# Patient Record
Sex: Male | Born: 1990 | Race: White | Hispanic: No | Marital: Single | Smoking: Current every day smoker
Health system: Southern US, Community
[De-identification: ages and names within clinical notes are randomized; demographics above are authoritative.]

## PROBLEM LIST (undated history)

## (undated) DIAGNOSIS — I10 Essential (primary) hypertension: Secondary | ICD-10-CM

## (undated) HISTORY — DX: Essential (primary) hypertension: I10

---
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
## 2010-11-05 HISTORY — PX: SCAPHOID FRACTURE SURGERY: SHX769

## 2011-09-06 ENCOUNTER — Other Ambulatory Visit: Payer: Self-pay | Admitting: Sports Medicine

## 2011-09-06 DIAGNOSIS — M79642 Pain in left hand: Secondary | ICD-10-CM

## 2011-09-06 DIAGNOSIS — IMO0002 Reserved for concepts with insufficient information to code with codable children: Secondary | ICD-10-CM

## 2011-09-06 DIAGNOSIS — S62009A Unspecified fracture of navicular [scaphoid] bone of unspecified wrist, initial encounter for closed fracture: Secondary | ICD-10-CM

## 2011-09-12 ENCOUNTER — Ambulatory Visit
Admission: RE | Admit: 2011-09-12 | Discharge: 2011-09-12 | Disposition: A | Discharge status: Home / Self Care | Payer: 59 | Source: Ambulatory Visit | Attending: Sports Medicine | Admitting: Sports Medicine

## 2011-09-12 DIAGNOSIS — S62009A Unspecified fracture of navicular [scaphoid] bone of unspecified wrist, initial encounter for closed fracture: Secondary | ICD-10-CM

## 2011-09-12 DIAGNOSIS — M79642 Pain in left hand: Secondary | ICD-10-CM

## 2011-09-12 DIAGNOSIS — IMO0002 Reserved for concepts with insufficient information to code with codable children: Secondary | ICD-10-CM

## 2015-06-03 ENCOUNTER — Ambulatory Visit (INDEPENDENT_AMBULATORY_CARE_PROVIDER_SITE_OTHER): Payer: 59 | Admitting: Family Medicine

## 2015-06-03 ENCOUNTER — Ambulatory Visit
Admission: RE | Admit: 2015-06-03 | Discharge: 2015-06-03 | Disposition: A | Discharge status: Home / Self Care | Payer: 59 | Source: Ambulatory Visit | Attending: Family Medicine | Admitting: Family Medicine

## 2015-06-03 ENCOUNTER — Encounter: Payer: Self-pay | Admitting: Family Medicine

## 2015-06-03 VITALS — BP 142/70 | HR 93 | Temp 99.1°F | Resp 18 | Ht 69.0 in | Wt 182.9 lb

## 2015-06-03 DIAGNOSIS — M7989 Other specified soft tissue disorders: Secondary | ICD-10-CM | POA: Insufficient documentation

## 2015-06-03 NOTE — Progress Notes (Signed)
Name: Timothy Padilla   MRN: [id number]    DOB: [birth year]   Date:06/03/2015       Progress Note  Subjective  Chief Complaint  Chief Complaint  Patient presents with  . Establish Care    NP (Dr. Carman Ching)  . Hand Pain    pain in right hand for x4 days.    HPI Pt. Is here for complaints of pain in the right hand and inability to fully extend the pinky finger. He noticed pain in the lateral aspect of right hand for 2-3 days which went away and now he cannot extend the pinky finger. He can make a fist with no problem. No history of trauma to right hand, wrist, or elbow. History reviewed. No pertinent past medical history.  Past Surgical History  Procedure Laterality Date  . Scaphoid fracture surgery Left 2012    History reviewed. No pertinent family history.  History   Social History  . Marital Status: Single    Spouse Name: N/A  . Number of Children: N/A  . Years of Education: N/A   Occupational History  . Not on file.   Social History Main Topics  . Smoking status: Current Every Day Smoker -- 2.00 packs/day for 9 years    Types: Cigarettes  . Smokeless tobacco: Former Neurosurgeon    Types: Snuff     Comment: just tried smokeless 2 times, does not currently use  . Alcohol Use: 0.0 oz/week    0 Standard drinks or equivalent per week     Comment: occasional  . Drug Use: No  . Sexual Activity:    Partners: Female    Pharmacist, hospital Protection: Condom   Other Topics Concern  . Not on file   Social History Narrative  . No narrative on file    No current outpatient prescriptions on file.  No Known Allergies   Review of Systems  Constitutional: Negative for fever, chills and malaise/fatigue.  Musculoskeletal: Positive for joint pain.      Objective  Filed Vitals:   06/03/15 1022  BP: 142/70  Pulse: 93  Temp: 99.1 F (37.3 C)  TempSrc: Oral  Resp: 18  Height:  (1.753 m)  Weight: 182 lb 14.4 oz (82.963 kg)  SpO2: 98%    Physical Exam  Constitutional:  He is well-developed, well-nourished, and in no distress.  Cardiovascular: Normal rate and regular rhythm.   Pulmonary/Chest: Effort normal and breath sounds normal.  Musculoskeletal:       Hands: Nursing note and vitals reviewed.     Assessment & Plan 1. Swelling of finger of right hand Obtain x-rays of right hand to rule out any acute abnormality. Recommended that he take Aleve every night at bedtime. Follow-up in 4 weeks. - DG Hand Complete Right; Future    Shadawn Hanaway Asad [facility] 06/03/2015 10:35 AM

## 2015-06-07 ENCOUNTER — Telehealth: Payer: Self-pay

## 2015-06-07 NOTE — Progress Notes (Signed)
Patient notified

## 2015-06-08 NOTE — Telephone Encounter (Signed)
Patient was notified of their hand X-Ray and states they are still having swell. I informed the patient the x-ray was normal to try Ibuprofen, Ice and heat, then if no improvements to come back for a visit.

## 2015-07-01 ENCOUNTER — Ambulatory Visit: Payer: 59 | Admitting: Family Medicine

## 2016-03-21 ENCOUNTER — Encounter: Payer: 59 | Admitting: Family Medicine

## 2016-03-27 ENCOUNTER — Ambulatory Visit (INDEPENDENT_AMBULATORY_CARE_PROVIDER_SITE_OTHER): Payer: 59 | Admitting: Family Medicine

## 2016-03-27 ENCOUNTER — Encounter: Payer: Self-pay | Admitting: Family Medicine

## 2016-03-27 VITALS — BP 138/81 | HR 87 | Temp 98.4°F | Resp 17 | Ht 69.0 in | Wt 197.1 lb

## 2016-03-27 DIAGNOSIS — IMO0001 Reserved for inherently not codable concepts without codable children: Secondary | ICD-10-CM

## 2016-03-27 DIAGNOSIS — R079 Chest pain, unspecified: Secondary | ICD-10-CM

## 2016-03-27 DIAGNOSIS — R030 Elevated blood-pressure reading, without diagnosis of hypertension: Secondary | ICD-10-CM | POA: Diagnosis not present

## 2016-03-27 DIAGNOSIS — Z566 Other physical and mental strain related to work: Secondary | ICD-10-CM

## 2016-03-27 HISTORY — DX: Other physical and mental strain related to work: Z56.6

## 2016-03-27 MED ORDER — BUSPIRONE HCL 7.5 MG PO TABS: 7.5000 mg | ORAL_TABLET | Freq: Two times a day (BID) | ORAL | Status: DC

## 2016-03-27 MED ORDER — AMLODIPINE BESYLATE 2.5 MG PO TABS: 2.5000 mg | ORAL_TABLET | Freq: Every day | ORAL | Status: DC

## 2016-03-27 NOTE — Progress Notes (Signed)
Name: Timothy Padilla   MRN: [id number]    DOB: 11/11/1990   Date:03/27/2016       Progress Note  Subjective  Chief Complaint  Chief Complaint  Patient presents with  . Annual Exam    CPE    Chest Pain  This is a recurrent problem. The current episode started more than 1 month ago (present for over 6 months.). The onset quality is gradual. The problem has been unchanged. The pain does not radiate. Associated symptoms include headaches (intermittent headaches once every 2-3 days, worse with stress, hunger, and irritability). Pertinent negatives include no fever or palpitations.   Intermittent but recurrent episodes of substernal and left sided chest pains, last for 1 hour or less, no radiation, nausea or vomiting, sometimes associated with elevated blood pressure ([facility] 223[facility] to 117/74). Sometimes brought on by feeling angry or irritable.   History reviewed. No pertinent past medical history.  Past Surgical History  Procedure Laterality Date  . Scaphoid fracture surgery Left 2012    History reviewed. No pertinent family history.  Social History   Social History  . Marital Status: Single    Spouse Name: N/A  . Number of Children: N/A  . Years of Education: N/A   Occupational History  . Not on file.   Social History Main Topics  . Smoking status: Current Every Day Smoker -- 2.00 packs/day for 9 years    Types: Cigarettes  . Smokeless tobacco: Former NeurosurgeonUser    Types: Snuff     Comment: just tried smokeless 2 times, does not currently use  . Alcohol Use: 0.0 oz/week    0 Standard drinks or equivalent per week     Comment: occasional  . Drug Use: No  . Sexual Activity:    Partners: Female    Pharmacist, hospitalBirth Control/ Protection: Condom   Other Topics Concern  . Not on file   Social History Narrative     Current outpatient prescriptions:  .  amLODipine (NORVASC) 2.5 MG tablet, Take 1 tablet (2.5 mg total) by  mouth daily., Disp: 90 tablet, Rfl: 0 .  busPIRone (BUSPAR) 7.5 MG tablet, Take 1 tablet (7.5 mg total) by mouth 2 (two) times daily., Disp: 60 tablet, Rfl: 0  No Known Allergies   Review of Systems  Constitutional: Negative for fever and chills.  Eyes: Negative for blurred vision (intermittent blurred vision).  Cardiovascular: Positive for chest pain. Negative for palpitations and leg swelling.  Neurological: Positive for headaches (intermittent headaches once every 2-3 days, worse with stress, hunger, and irritability).  Psychiatric/Behavioral: The patient is nervous/anxious.     Objective  Filed Vitals:   03/27/16 1109  BP: 138/81  Pulse: 87  Temp: 98.4 F (36.9 C)  TempSrc: Oral  Resp: 17  Height: 5\' 9"  (1.753 m)  Weight: 197 lb 1.6 oz (89.404 kg)  SpO2: 98%    Physical Exam  Constitutional: He is oriented to person, place, and time and well-developed, well-nourished, and in no distress.  HENT:  Head: Normocephalic and atraumatic.  Cardiovascular: Normal rate and regular rhythm.   Pulmonary/Chest: Effort normal and breath sounds normal.  Neurological: He is alert and oriented to person, place, and time.  Psychiatric: Mood, memory, affect and judgment normal.  Nursing note and vitals reviewed.       Assessment & Plan  1. Chest pain, unspecified chest pain type Baird LyonsCasey reassuring, however with recurrent chest pain and  elevated blood pressure, referred to cardiology - EKG 12-Lead - Ambulatory referral to Cardiology  2. Elevated blood pressure Start on low-dose amlodipine, obtain labs for evaluation of possible end organ damage. Follow-up with blood pressure assessment in one month - CBC with Differential - Comprehensive Metabolic Panel (CMET) - amLODipine (NORVASC) 2.5 MG tablet; Take 1 tablet (2.5 mg total) by mouth daily.  Dispense: 90 tablet; Refill: 0  3. Stress at work [employer] on Buspirone 7.5 g twice daily, educated on adverse effects, follow-up in one  month. - busPIRone (BUSPAR) 7.5 MG tablet; Take 1 tablet (7.5 mg total) by mouth 2 (two) times daily.  Dispense: 60 tablet; Refill: 0   Keslyn Teater [facility] 03/27/2016 8:05 PM

## 2016-03-28 LAB — CBC WITH DIFFERENTIAL/PLATELET
BASOS ABS: 0 10*3/uL (ref 0.0–0.2)
Basos: 0 %
EOS (ABSOLUTE): 0.1 10*3/uL (ref 0.0–0.4)
Eos: 2 %
HEMATOCRIT: 45.4 % (ref 37.5–51.0)
Hemoglobin: 16.2 g/dL (ref 12.6–17.7)
IMMATURE GRANULOCYTES: 0 %
Immature Grans (Abs): 0 10*3/uL (ref 0.0–0.1)
LYMPHS ABS: 1.5 10*3/uL (ref 0.7–3.1)
Lymphs: 35 %
MCH: 31.5 pg (ref 26.6–33.0)
MCHC: 35.7 g/dL (ref 31.5–35.7)
MCV: 88 fL (ref 79–97)
Monocytes Absolute: 0.3 10*3/uL (ref 0.1–0.9)
Monocytes: 6 %
NEUTROS PCT: 57 %
Neutrophils Absolute: 2.5 10*3/uL (ref 1.4–7.0)
PLATELETS: 143 10*3/uL — AB (ref 150–379)
RBC: 5.15 x10E6/uL (ref 4.14–5.80)
RDW: 12.5 % (ref 12.3–15.4)
WBC: 4.3 10*3/uL (ref 3.4–10.8)

## 2016-03-28 LAB — COMPREHENSIVE METABOLIC PANEL
ALK PHOS: 69 IU/L (ref 39–117)
ALT: 19 IU/L (ref 0–44)
AST: 17 IU/L (ref 0–40)
Albumin/Globulin Ratio: 1.7 (ref 1.2–2.2)
Albumin: 4.3 g/dL (ref 3.5–5.5)
BUN/Creatinine Ratio: 18 (ref 9–20)
BUN: 13 mg/dL (ref 6–20)
Bilirubin Total: 0.8 mg/dL (ref 0.0–1.2)
CO2: 24 mmol/L (ref 18–29)
CREATININE: 0.74 mg/dL — AB (ref 0.76–1.27)
Calcium: 9.3 mg/dL (ref 8.7–10.2)
Chloride: 102 mmol/L (ref 96–106)
GFR calc Af Amer: 148 mL/min/{1.73_m2} (ref >59)
GFR calc non Af Amer: 128 mL/min/{1.73_m2} (ref >59)
GLOBULIN, TOTAL: 2.6 g/dL (ref 1.5–4.5)
Glucose: 91 mg/dL (ref 65–99)
POTASSIUM: 4.8 mmol/L (ref 3.5–5.2)
SODIUM: 141 mmol/L (ref 134–144)
Total Protein: 6.9 g/dL (ref 6.0–8.5)

## 2016-03-29 ENCOUNTER — Ambulatory Visit (INDEPENDENT_AMBULATORY_CARE_PROVIDER_SITE_OTHER): Payer: 59 | Admitting: Cardiology

## 2016-03-29 ENCOUNTER — Encounter: Payer: Self-pay | Admitting: Cardiology

## 2016-03-29 VITALS — BP 120/88 | HR 68 | Ht 69.0 in | Wt 192.0 lb

## 2016-03-29 DIAGNOSIS — R079 Chest pain, unspecified: Secondary | ICD-10-CM

## 2016-03-29 DIAGNOSIS — R0602 Shortness of breath: Secondary | ICD-10-CM | POA: Diagnosis not present

## 2016-03-29 DIAGNOSIS — R002 Palpitations: Secondary | ICD-10-CM | POA: Diagnosis not present

## 2016-03-29 NOTE — Progress Notes (Signed)
Cardiology Office Note   Date:  03/29/2016   ID:  Timothy Padilla, DOB 06/17/1991, MRN [id number]  Referring Doctor:  Brayton ElSyed Shah, MD   Cardiologist:   Almond LintAileen Jenavee Laguardia, MD   Reason for consultation:  Chief Complaint  Patient presents with  . other    Ref by Dr. Sherryll BurgerShah for chest pain. Meds reviewed by the patient verbally. Pt. c/o chest pain that will radiate to his neck, fluttering in chest and shortness of breath.        History of Present Illness: Timothy Figuresoah Hosack is a 25 y.o. male who presents forChest pain and shortness of breath. This has been going on for quite some time now. Chest pain is located in the anterior chest, sometimes radiating to the left side of the chest. 4-5 out of 10 in severity. Described as a sharp pain that lasts minutes at a time. Randomly occurring. Not really related to positional changes in the arms or the upper body. Sometimes associated with involuntary movement of the neck/head. Associated with some shortness of breath.  Palpitations, not on a daily basis, feeling fluttering sensation in chest, lasts minutes at a time, symptoms mainly in the chest, nonradiating, randomly occurring been going on for years  No fever, cough, colds, abdominal pain. No PND, orthopnea, edema.   ROS:  Please see the history of present illness. Aside from mentioned under HPI, all other systems are reviewed and negative.     Past Medical History  Diagnosis Date  . Hypertension     Past Surgical History  Procedure Laterality Date  . Scaphoid fracture surgery Left 2012     reports that he has been smoking Cigarettes.  He has a 18 pack-year smoking history. He has quit using smokeless tobacco. His smokeless tobacco use included Snuff. He reports that he drinks alcohol. He reports that he does not use illicit drugs.   family history includes Heart attack in his maternal grandmother and paternal grandmother; Hypertension in his father and paternal grandmother.   Current Outpatient  Prescriptions  Medication Sig Dispense Refill  . amLODipine (NORVASC) 2.5 MG tablet Take 1 tablet (2.5 mg total) by mouth daily. 90 tablet 0  . busPIRone (BUSPAR) 7.5 MG tablet Take 1 tablet (7.5 mg total) by mouth 2 (two) times daily. 60 tablet 0   No current facility-administered medications for this visit.    Allergies: Review of patient's allergies indicates no known allergies.    PHYSICAL EXAM: VS:  BP 120/88 mmHg  Pulse 68  Ht 5\' 9"  (1.753 m)  Wt 192 lb (87.091 kg)  BMI 28.34 kg/m2 , Body mass index is 28.34 kg/(m^2). Wt Readings from Last 3 Encounters:  03/29/16 192 lb (87.091 kg)  03/27/16 197 lb 1.6 oz (89.404 kg)  06/03/15 182 lb 14.4 oz (82.963 kg)    GENERAL:  well developed, well nourished, not in acute distress HEENT: normocephalic, pink conjunctivae, anicteric sclerae, no xanthelasma, normal dentition, oropharynx clear NECK:  no neck vein engorgement, JVP normal, no hepatojugular reflux, carotid upstroke brisk and symmetric, no bruit, no thyromegaly, no lymphadenopathy LUNGS:  good respiratory effort, clear to auscultation bilaterally CV:  PMI not displaced, no thrills, no lifts, S1 and S2 within normal limits, no palpable S3 or S4, no murmurs, no rubs, no gallops ABD:  Soft, nontender, nondistended, normoactive bowel sounds, no abdominal aortic bruit, no hepatomegaly, no splenomegaly MS: nontender back, no kyphosis, no scoliosis, no joint deformities EXT:  2+ DP/PT pulses, no edema, no varicosities, no cyanosis,  no clubbing SKIN: warm, nondiaphoretic, normal turgor, no ulcers NEUROPSYCH: alert, oriented to person, place, and time, sensory/motor grossly intact, normal mood, appropriate affect  Recent Labs: 03/27/2016: ALT 19; BUN 13; Creatinine, Ser 0.74*; Platelets 143*; Potassium 4.8; Sodium 141   Lipid Panel No results found for: CHOL, TRIG, HDL, CHOLHDL, VLDL, LDLCALC, LDLDIRECT   Other studies Reviewed:  EKG:  The ekg from 03/29/2016 was personally  reviewed by me and it revealed sinus rhythm, 70 BPM.  Additional studies/ records that were reviewed personally reviewed by me today include: None available   ASSESSMENT AND PLAN: Chest pain, atypical, possibly musculoskeletal Shortness of breath Risk factors for CAD are not significant but mainly smoking history.  Stress testing will provide a rule out of CAD. Plain treadmill test. Recommend echocardiogram.   Palpitations  Recommend objective evaluation with cardiac monitor.   Tobacco use  We discussed the importance of smoking cessation and different strategies for quitting.   Current medicines are reviewed at length with the patient today.  The patient does not have concerns regarding medicines.  Labs/ tests ordered today include:  Orders Placed This Encounter  Procedures  . Exercise Tolerance Test  . Cardiac event monitor  . EKG 12-Lead  . ECHOCARDIOGRAM COMPLETE    I had a lengthy and detailed discussion with the patient regarding diagnoses, prognosis, diagnostic options, treatment options .   I counseled the patient on importance of lifestyle modification including heart healthy diet, regular physical activity  , and smoking cessation.   Disposition:   FU with undersigned after tests When necessary  Signed, Almond Lint, MD  03/29/2016 12:25 PM    [facility]

## 2016-03-29 NOTE — Patient Instructions (Addendum)
Medication Instructions:  Your physician recommends that you continue on your current medications as directed. Please refer to the Current Medication list given to you today.   Labwork: None ordered  Testing/Procedures: Your physician has requested that you have an echocardiogram. Echocardiography is a painless test that uses sound waves to create images of your heart. It provides your doctor with information about the size and shape of your heart and how well your heart's chambers and valves are working. This procedure takes approximately one hour. There are no restrictions for this procedure.  Date & Time: ______________________________________________________________  Your physician has requested that you have an exercise tolerance test. For further information please visit [email or web address] Please also follow instruction sheet, as given.  Date & Time: ________________________________________________________________  Your physician has recommended that you wear an event monitor. Event monitors are medical devices that record the heart's electrical activity. Doctors most often [place] these monitors to diagnose arrhythmias. Arrhythmias are problems with the speed or rhythm of the heartbeat. The monitor is a small, portable device. You can wear one while you do your normal daily activities. This is usually used to diagnose what is causing palpitations/syncope (passing out).  Will be mailed to your home.   Follow-Up: Your physician recommends that you schedule a follow-up appointment as needed. We will call you with results and if needed will schedule follow up at that time.    Any Other Special Instructions Will Be Listed Below (If Applicable).     If you need a refill on your cardiac medications before your next appointment, please call your pharmacy.  Echocardiogram An echocardiogram, or echocardiography, uses sound waves (ultrasound) to produce an image of your heart. The  echocardiogram is simple, painless, obtained within a short period of time, and offers valuable information to your health care provider. The images from an echocardiogram can provide information such as:  Evidence of coronary artery disease (CAD).  Heart size.  Heart muscle function.  Heart valve function.  Aneurysm detection.  Evidence of a past heart attack.  Fluid buildup around the heart.  Heart muscle thickening.  Assess heart valve function. LET [facility] CARE PROVIDER KNOW ABOUT:  Any allergies you have.  All medicines you are taking, including vitamins, herbs, eye drops, creams, and over-the-counter medicines.  Previous problems you or members of your family have had with the use of anesthetics.  Any blood disorders you have.  Previous surgeries you have had.  Medical conditions you have.  Possibility of pregnancy, if this applies. BEFORE THE PROCEDURE  No special preparation is needed. Eat and drink normally.  PROCEDURE   In order to produce an image of your heart, gel will be applied to your chest and a wand-like tool (transducer) will be moved over your chest. The gel will help transmit the sound waves from the transducer. The sound waves will harmlessly bounce off your heart to allow the heart images to be captured in real-time motion. These images will then be recorded.  You may need an IV to receive a medicine that improves the quality of the pictures. AFTER THE PROCEDURE You may return to your normal schedule including diet, activities, and medicines, unless your health care provider tells you otherwise.   This information is not intended to replace advice given to you by your health care provider. Make sure you discuss any questions you have with your health care provider.   Document Released: 10/19/2000 Document Revised: 11/12/2014 Document Reviewed: 06/29/2013 Elsevier Interactive Patient Education 2016 Elsevier  Inc.   Exercise Stress  Electrocardiogram An exercise stress electrocardiogram is a test that is done to evaluate the blood supply to your heart. This test may also be called exercise stress electrocardiography. The test is done while you are walking on a treadmill. The goal of this test is to raise your heart rate. This test is done to find areas of poor blood flow to the heart by determining the extent of coronary artery disease (CAD).   CAD is defined as narrowing in one or more heart (coronary) arteries of more than 70%. If you have an abnormal test result, this may mean that you are not getting adequate blood flow to your heart during exercise. Additional testing may be needed to understand why your test was abnormal. LET [facility] CARE PROVIDER KNOW ABOUT:   Any allergies you have.  All medicines you are taking, including vitamins, herbs, eye drops, creams, and over-the-counter medicines.  Previous problems you or members of your family have had with the use of anesthetics.  Any blood disorders you have.  Previous surgeries you have had.  Medical conditions you have.  Possibility of pregnancy, if this applies. RISKS AND COMPLICATIONS Generally, this is a safe procedure. However, as with any procedure, complications can occur. Possible complications can include:  Pain or pressure in the following areas:  Chest.  Jaw or neck.  Between your shoulder blades.  Radiating down your left arm.  Dizziness or light-headedness.  Shortness of breath.  Increased or irregular heartbeats.  Nausea or vomiting.  Heart attack (rare). BEFORE THE PROCEDURE  Avoid all forms of caffeine 24 hours before your test or as directed by your health care provider. This includes coffee, tea (even decaffeinated tea), caffeinated sodas, chocolate, cocoa, and certain pain medicines.  Follow your health care provider's instructions regarding eating and drinking before the test.  Take your medicines as directed at regular  times with water unless instructed otherwise. Exceptions may include:  If you have diabetes, ask how you are to take your insulin or pills. It is common to adjust insulin dosing the morning of the test.  If you are taking beta-blocker medicines, it is important to talk to your health care provider about these medicines well before the date of your test. Taking beta-blocker medicines may interfere with the test. In some cases, these medicines need to be changed or stopped 24 hours or more before the test.  If you wear a nitroglycerin patch, it may need to be removed prior to the test. Ask your health care provider if the patch should be removed before the test.  If you use an inhaler for any breathing condition, bring it with you to the test.  If you are an outpatient, bring a snack so you can eat right after the stress phase of the test.  Do not smoke for 4 hours prior to the test or as directed by your health care provider.  Do not apply lotions, powders, creams, or oils on your chest prior to the test.  Wear loose-fitting clothes and comfortable shoes for the test. This test involves walking on a treadmill. PROCEDURE  Multiple patches (electrodes) will be put on your chest. If needed, small areas of your chest may have to be shaved to get better contact with the electrodes. Once the electrodes are attached to your body, multiple wires will be attached to the electrodes and your heart rate will be monitored.  Your heart will be monitored both at rest and  while exercising.  You will walk on a treadmill. The treadmill will be started at a slow pace. The treadmill speed and incline will gradually be increased to raise your heart rate. AFTER THE PROCEDURE  Your heart rate and blood pressure will be monitored after the test.  You may return to your normal schedule including diet, activities, and medicines, unless your health care provider tells you otherwise.   This information is not  intended to replace advice given to you by your health care provider. Make sure you discuss any questions you have with your health care provider.   Document Released: 10/19/2000 Document Revised: 10/27/2013 Document Reviewed: 06/29/2013 Elsevier Interactive Patient Education 2016 Elsevier Inc.   Cardiac Event Monitoring A cardiac event monitor is a small recording device used to help detect abnormal heart rhythms (arrhythmias). The monitor is used to record heart rhythm when noticeable symptoms such as the following occur:  Fast heartbeats (palpitations), such as heart racing or fluttering.  Dizziness.  Fainting or light-headedness.  Unexplained weakness. The monitor is wired to two electrodes placed on your chest. Electrodes are flat, sticky disks that attach to your skin. The monitor can be worn for up to 30 days. You will wear the monitor at all times, except when bathing.  HOW TO USE YOUR CARDIAC EVENT MONITOR A technician will prepare your chest for the electrode placement. The technician will show you how to place the electrodes, how to work the monitor, and how to replace the batteries. Take time to practice using the monitor before you leave the office. Make sure you understand how to send the information from the monitor to your health care provider. This requires a telephone with a landline, not a cell phone. You need to:  Wear your monitor at all times, except when you are in water:  Do not get the monitor wet.  Take the monitor off when bathing. Do not swim or use a hot tub with it on.  Keep your skin clean. Do not put body lotion or moisturizer on your chest.  Change the electrodes daily or any time they stop sticking to your skin. You might need to use tape to keep them on.  It is possible that your skin under the electrodes could become irritated. To keep this from happening, try to put the electrodes in slightly different places on your chest. However, they must remain  in the area under your left breast and in the upper right section of your chest.  Make sure the monitor is safely clipped to your clothing or in a location close to your body that your health care provider recommends.  Press the button to record when you feel symptoms of heart trouble, such as dizziness, weakness, light-headedness, palpitations, thumping, shortness of breath, unexplained weakness, or a fluttering or racing heart. The monitor is always on and records what happened slightly before you pressed the button, so do not worry about being too late to get good information.  Keep a diary of your activities, such as walking, doing chores, and taking medicine. It is especially important to note what you were doing when you pushed the button to record your symptoms. This will help your health care provider determine what might be contributing to your symptoms. The information stored in your monitor will be reviewed by your health care provider alongside your diary entries.  Send the recorded information as recommended by your health care provider. It is important to understand that it will take some  time for your health care provider to process the results.  Change the batteries as recommended by your health care provider. SEEK IMMEDIATE MEDICAL CARE IF:   You have chest pain.  You have extreme difficulty breathing or shortness of breath.  You develop a very fast heartbeat that persists.  You develop dizziness that does not go away.  You faint or constantly feel you are about to faint.   This information is not intended to replace advice given to you by your health care provider. Make sure you discuss any questions you have with your health care provider.   Document Released: 07/31/2008 Document Revised: 11/12/2014 Document Reviewed: 04/20/2013 Elsevier Interactive Patient Education [employer]2016 Elsevier Inc.

## 2016-03-30 ENCOUNTER — Ambulatory Visit (INDEPENDENT_AMBULATORY_CARE_PROVIDER_SITE_OTHER): Payer: 59

## 2016-03-30 DIAGNOSIS — R0602 Shortness of breath: Secondary | ICD-10-CM

## 2016-03-30 DIAGNOSIS — R002 Palpitations: Secondary | ICD-10-CM | POA: Diagnosis not present

## 2016-03-30 DIAGNOSIS — R079 Chest pain, unspecified: Secondary | ICD-10-CM

## 2016-03-30 LAB — EXERCISE TOLERANCE TEST
CHL CUP MPHR: 195 {beats}/min
CHL CUP STRESS STAGE 1 GRADE: 0 %
CHL CUP STRESS STAGE 1 HR: 101 {beats}/min
CHL CUP STRESS STAGE 2 HR: 90 {beats}/min
CHL CUP STRESS STAGE 2 SBP: 121 mmHg
CHL CUP STRESS STAGE 3 SPEED: 0 mph
CHL CUP STRESS STAGE 4 GRADE: 10 %
CHL CUP STRESS STAGE 4 SPEED: 1.7 mph
CHL CUP STRESS STAGE 5 GRADE: 12 %
CHL CUP STRESS STAGE 5 HR: 116 {beats}/min
CHL CUP STRESS STAGE 5 SBP: 158 mmHg
CHL CUP STRESS STAGE 5 SPEED: 2.5 mph
CHL CUP STRESS STAGE 6 HR: 137 {beats}/min
CHL CUP STRESS STAGE 6 SBP: 161 mmHg
CHL CUP STRESS STAGE 7 SPEED: 4.2 mph
CHL CUP STRESS STAGE 8 GRADE: 0 %
CHL CUP STRESS STAGE 8 HR: 115 {beats}/min
CHL CUP STRESS STAGE 8 SPEED: 0 mph
CHL CUP STRESS STAGE 9 DBP: 67 mmHg
CHL CUP STRESS STAGE 9 HR: 86 {beats}/min
CHL CUP STRESS STAGE 9 SBP: 153 mmHg
CSEPHR: 86 %
CSEPPMHR: 80 %
Estimated workload: 12.3 METS
Exercise duration (min): 10 min
Exercise duration (sec): 20 s
Peak HR: 157 {beats}/min
Rest HR: 99 {beats}/min
Stage 1 Speed: 0 mph
Stage 2 DBP: 82 mmHg
Stage 2 Grade: 0 %
Stage 2 Speed: 0 mph
Stage 3 Grade: 0 %
Stage 3 HR: 91 {beats}/min
Stage 4 DBP: 66 mmHg
Stage 4 HR: 106 {beats}/min
Stage 4 SBP: 137 mmHg
Stage 5 DBP: 71 mmHg
Stage 6 DBP: 70 mmHg
Stage 6 Grade: 14 %
Stage 6 Speed: 3.4 mph
Stage 7 Grade: 16 %
Stage 7 HR: 157 {beats}/min
Stage 9 Grade: 0 %
Stage 9 Speed: 0 mph

## 2016-04-05 ENCOUNTER — Telehealth: Payer: Self-pay | Admitting: Cardiology

## 2016-04-05 NOTE — Telephone Encounter (Signed)
[employer] needs the icd 10 and icd 9 dx codes .  Please call patient as he needs to contact [facility].

## 2016-04-05 NOTE — Telephone Encounter (Signed)
Left message on machine for patient to contact the office.   

## 2016-04-06 NOTE — Telephone Encounter (Signed)
Left voicemail message for patient to call back.

## 2016-04-06 NOTE — Telephone Encounter (Signed)
Patient wanted diagnosis codes to make sure his event monitor was covered by insurance. Provided her with the ICD 9 and ICD 10 codes and also let her know that we send off for pre-certification as well to make sure it is covered. She did want to know what their out of pocket amount would be and I let her know that they would need to call his benefits to see what that amount might be. She verbalized understanding and had no further questions at this time.

## 2016-04-13 ENCOUNTER — Other Ambulatory Visit: Payer: 59

## 2016-04-13 ENCOUNTER — Encounter: Payer: Self-pay | Admitting: *Deleted

## 2016-04-18 ENCOUNTER — Telehealth: Payer: Self-pay | Admitting: Cardiology

## 2016-04-18 NOTE — Telephone Encounter (Signed)
Received an email from [employer] stated that patient sent the event monitor back and refused due to insurance quote. Patients girlfriend called insurance and they reported the cost to be $2,000.00 instead of the network rate which would be $155.00. Patients girlfriend was not given a quote by insurance because they do not have a release on file to speak with her regarding patient. Therefore they gave her the market price. Attempted to call patient to speak with them about the monitor. Left voicemail message for them to call me back so I can talk with them in detail about this.

## 2016-04-18 NOTE — Telephone Encounter (Signed)
Left detailed voicemail message with estimated cost of $155.00 per [employer] and instructions to please call back regarding this test.

## 2016-04-18 NOTE — Telephone Encounter (Signed)
Spoke with patients girlfriend per release form and discussed the reduced rate that was estimated based on their insurance. She states that they do not want to move forward with that test due to the cost. Let them know that if they change their mind or need anything else to please call back at anytime. She verbalized understanding of our conversation and had no further questions.

## 2016-04-27 ENCOUNTER — Ambulatory Visit (INDEPENDENT_AMBULATORY_CARE_PROVIDER_SITE_OTHER): Payer: 59 | Admitting: Family Medicine

## 2016-04-27 ENCOUNTER — Encounter: Payer: Self-pay | Admitting: Family Medicine

## 2016-04-27 VITALS — BP 108/64 | HR 78 | Temp 98.9°F | Resp 16 | Ht 69.0 in | Wt 197.6 lb

## 2016-04-27 DIAGNOSIS — Z566 Other physical and mental strain related to work: Secondary | ICD-10-CM

## 2016-04-27 DIAGNOSIS — D696 Thrombocytopenia, unspecified: Secondary | ICD-10-CM | POA: Diagnosis not present

## 2016-04-27 DIAGNOSIS — R030 Elevated blood-pressure reading, without diagnosis of hypertension: Secondary | ICD-10-CM

## 2016-04-27 DIAGNOSIS — IMO0001 Reserved for inherently not codable concepts without codable children: Secondary | ICD-10-CM

## 2016-04-27 LAB — CBC WITH DIFFERENTIAL/PLATELET
BASOS ABS: 0 {cells}/uL (ref 0–200)
BASOS PCT: 0 %
EOS ABS: 104 {cells}/uL (ref 15–500)
Eosinophils Relative: 2 %
HEMATOCRIT: 48.3 % (ref 38.5–50.0)
HEMOGLOBIN: 16.9 g/dL (ref 13.2–17.1)
LYMPHS ABS: 1872 {cells}/uL (ref 850–3900)
Lymphocytes Relative: 36 %
MCH: 31.6 pg (ref 27.0–33.0)
MCHC: 35 g/dL (ref 32.0–36.0)
MCV: 90.3 fL (ref 80.0–100.0)
MONO ABS: 468 {cells}/uL (ref 200–950)
MPV: 10.4 fL (ref 7.5–12.5)
Monocytes Relative: 9 %
NEUTROS ABS: 2756 {cells}/uL (ref 1500–7800)
Neutrophils Relative %: 53 %
Platelets: 146 10*3/uL (ref 140–400)
RBC: 5.35 MIL/uL (ref 4.20–5.80)
RDW: 12.9 % (ref 11.0–15.0)
WBC: 5.2 10*3/uL (ref 3.8–10.8)

## 2016-04-27 MED ORDER — BUSPIRONE HCL 7.5 MG PO TABS: 7.5000 mg | ORAL_TABLET | Freq: Two times a day (BID) | ORAL | Status: DC

## 2016-04-27 NOTE — Progress Notes (Signed)
Name: Timothy Padilla   MRN: [id number]    DOB: 10/31/1991   Date:04/27/2016       Progress Note  Subjective  Chief Complaint  Chief Complaint  Patient presents with  . Hypertension    2 week follow up    Hypertension This is a recurrent problem. The problem is controlled. Pertinent negatives include no blurred vision, chest pain, headaches, palpitations or shortness of breath. Risk factors for coronary artery disease include stress. Past treatments include calcium channel blockers. There are no compliance problems.  There is no history of kidney disease, CAD/MI or CVA.    Stress At Work:  Pt. Returns for assessment of stress and anxiety, mainly attributed to work environment. He was started on Buspirone 7.5 mg twice daily at last visit. He reports improvement in symptoms of anxiety and stress after being on Buspirone. He is able to handle the stressful situations better at work. Reports no adverse effects.  Elevated Platelet Count:  Pt.'s lab work drawn at last office visit showed below normal platelet count. He reports no episodes of bleeding, no hereditary bleeding disorder.      Past Medical History  Diagnosis Date  . Hypertension     Past Surgical History  Procedure Laterality Date  . Scaphoid fracture surgery Left 2012    Family History  Problem Relation Age of Onset  . Hypertension Father   . Heart attack Maternal Grandmother   . Heart attack Paternal Grandmother   . Hypertension Paternal Grandmother     Social History   Social History  . Marital Status: Single    Spouse Name: N/A  . Number of Children: N/A  . Years of Education: N/A   Occupational History  . Not on file.   Social History Main Topics  . Smoking status: Current Every Day Smoker -- 2.00 packs/day for 9 years    Types: Cigarettes  . Smokeless tobacco: Former NeurosurgeonUser    Types: Snuff     Comment: just tried smokeless 2 times, does not currently use  . Alcohol Use: 0.0 oz/week    0 Standard  drinks or equivalent per week     Comment: occasional  . Drug Use: No  . Sexual Activity:    Partners: Female    Pharmacist, hospitalBirth Control/ Protection: Condom   Other Topics Concern  . Not on file   Social History Narrative     Current outpatient prescriptions:  .  amLODipine (NORVASC) 2.5 MG tablet, Take 1 tablet (2.5 mg total) by mouth daily., Disp: 90 tablet, Rfl: 0 .  busPIRone (BUSPAR) 7.5 MG tablet, Take 1 tablet (7.5 mg total) by mouth 2 (two) times daily., Disp: 60 tablet, Rfl: 0  No Known Allergies   Review of Systems  Eyes: Negative for blurred vision.  Respiratory: Negative for shortness of breath.   Cardiovascular: Negative for chest pain and palpitations.  Gastrointestinal: Negative for blood in stool.  Genitourinary: Negative for hematuria.  Neurological: Negative for headaches.  Psychiatric/Behavioral: The patient is nervous/anxious.     Objective  Filed Vitals:   04/27/16 1120  BP: 108/64  Pulse: 78  Temp: 98.9 F (37.2 C)  TempSrc: Oral  Resp: 16  Height: 5\' 9"  (1.753 m)  Weight: 197 lb 9.6 oz (89.631 kg)  SpO2: 98%    Physical Exam  Constitutional: He is oriented to person, place, and time and well-developed, well-nourished, and in no distress.  Cardiovascular: Normal rate, regular rhythm, S1 normal and S2 normal.   Murmur heard.  Systolic murmur is present with a grade of 2/6  Pulmonary/Chest: Effort normal and breath sounds normal. He has no wheezes. He has no rales.  Abdominal: Soft. Bowel sounds are normal. There is no tenderness.  Neurological: He is alert and oriented to person, place, and time.  Nursing note and vitals reviewed.    Assessment & Plan  1. Elevated blood pressure Blood pressure has considerably improved with initiation of antihypertensive therapy. I suspect that his stress also plays a big role in his elevated blood pressure. Blood Pressure in our office today is 108/64 and I asked him to take amlodipine 2.5 mg every other day.  Recheck in one month  2. Stress at work Stable and steadily improving, continue on BuSpar as prescribed - busPIRone (BUSPAR) 7.5 MG tablet; Take 1 tablet (7.5 mg total) by mouth 2 (two) times daily.  Dispense: 60 tablet; Refill: 0  3. Decreased platelet count (HCC)  - CBC with Differential   Timothy Padilla A. Faylene [facility] 04/27/2016 11:44 AM

## 2016-05-28 ENCOUNTER — Encounter: Payer: Self-pay | Admitting: Family Medicine

## 2016-05-28 ENCOUNTER — Ambulatory Visit (INDEPENDENT_AMBULATORY_CARE_PROVIDER_SITE_OTHER): Payer: 59 | Admitting: Family Medicine

## 2016-05-28 DIAGNOSIS — Z566 Other physical and mental strain related to work: Secondary | ICD-10-CM | POA: Diagnosis not present

## 2016-05-28 DIAGNOSIS — R030 Elevated blood-pressure reading, without diagnosis of hypertension: Secondary | ICD-10-CM | POA: Diagnosis not present

## 2016-05-28 DIAGNOSIS — IMO0001 Reserved for inherently not codable concepts without codable children: Secondary | ICD-10-CM

## 2016-05-28 MED ORDER — BUSPIRONE HCL 7.5 MG PO TABS: 7.5000 mg | ORAL_TABLET | Freq: Two times a day (BID) | ORAL | 1 refills | Status: DC

## 2016-05-28 MED ORDER — AMLODIPINE BESYLATE 2.5 MG PO TABS: 2.5000 mg | ORAL_TABLET | Freq: Every day | ORAL | 1 refills | Status: DC

## 2016-05-28 NOTE — Progress Notes (Signed)
Name: Timothy Padilla   MRN: [id number]    DOB: Jan 12, 1991   Date:05/28/2016       Progress Note  Subjective  Chief Complaint  Chief Complaint  Patient presents with  . Hypertension    follow up , med refills  . Anxiety    Anxiety  Presents for follow-up visit. Symptoms include excessive worry, irritability and nervous/anxious behavior. Patient reports no chest pain, depressed mood, palpitations, panic or shortness of breath. Symptoms occur most days. The quality of sleep is good.    Hypertension  This is a chronic problem. The problem has been gradually improving since onset. Associated symptoms include anxiety. Pertinent negatives include no chest pain, headaches, palpitations or shortness of breath. Past treatments include calcium channel blockers. The current treatment provides significant improvement. There is no history of kidney disease or CAD/MI.     Past Medical History:  Diagnosis Date  . Hypertension     Past Surgical History:  Procedure Laterality Date  . SCAPHOID FRACTURE SURGERY Left 2012    Family History  Problem Relation Age of Onset  . Hypertension Father   . Heart attack Maternal Grandmother   . Heart attack Paternal Grandmother   . Hypertension Paternal Grandmother     Social History   Social History  . Marital status: Single    Spouse name: N/A  . Number of children: N/A  . Years of education: N/A   Occupational History  . Not on file.   Social History Main Topics  . Smoking status: Current Every Day Smoker    Packs/day: 2.00    Years: 9.00    Types: Cigarettes  . Smokeless tobacco: Former Neurosurgeon    Types: Snuff     Comment: just tried smokeless 2 times, does not currently use  . Alcohol use 0.0 oz/week     Comment: occasional  . Drug use: No  . Sexual activity: Yes    Partners: Female    Birth control/ protection: Condom   Other Topics Concern  . Not on file   Social History Narrative  . No narrative on file     Current  Outpatient Prescriptions:  .  amLODipine (NORVASC) 2.5 MG tablet, Take 1 tablet (2.5 mg total) by mouth daily., Disp: 90 tablet, Rfl: 0 .  busPIRone (BUSPAR) 7.5 MG tablet, Take 1 tablet (7.5 mg total) by mouth 2 (two) times daily., Disp: 60 tablet, Rfl: 0  No Known Allergies   Review of Systems  Constitutional: Positive for irritability. Negative for chills and fever.  Respiratory: Negative for shortness of breath.   Cardiovascular: Negative for chest pain and palpitations.  Neurological: Negative for headaches.  Psychiatric/Behavioral: The patient is nervous/anxious.      Objective  Vitals:   05/28/16 0952  BP: 120/70  Pulse: 73  Resp: 18  Temp: 98.6 F (37 C)  TempSrc: Oral  SpO2: 99%  Weight: 198 lb 12.8 oz (90.2 kg)  Height:  (1.753 m)    Physical Exam  Constitutional: He is oriented to person, place, and time and well-developed, well-nourished, and in no distress.  HENT:  Head: Normocephalic and atraumatic.  Cardiovascular: Normal rate and regular rhythm.   No murmur heard. Pulmonary/Chest: Effort normal and breath sounds normal. He has no wheezes.  Abdominal: Bowel sounds are normal. There is no tenderness.  Musculoskeletal: He exhibits no edema.  Neurological: He is alert and oriented to person, place, and time.  Psychiatric: Mood, memory, affect and judgment normal.  Nursing note and  vitals reviewed.     Assessment & Plan  1. Elevated blood pressure  - amLODipine (NORVASC) 2.5 MG tablet; Take 1 tablet (2.5 mg total) by mouth daily.  Dispense: 90 tablet; Refill: 1  2. Stress at work  - busPIRone (BUSPAR) 7.5 MG tablet; Take 1 tablet (7.5 mg total) by mouth 2 (two) times daily.  Dispense: 180 tablet; Refill: 1    Noa Galvao Asad [facility] 05/28/2016 10:14 AM

## 2016-07-10 ENCOUNTER — Encounter: Payer: 59 | Admitting: Family Medicine

## 2016-07-11 ENCOUNTER — Ambulatory Visit (INDEPENDENT_AMBULATORY_CARE_PROVIDER_SITE_OTHER): Payer: 59 | Admitting: Family Medicine

## 2016-07-11 ENCOUNTER — Encounter: Payer: Self-pay | Admitting: Family Medicine

## 2016-07-11 DIAGNOSIS — Z0000 Encounter for general adult medical examination without abnormal findings: Secondary | ICD-10-CM | POA: Diagnosis not present

## 2016-07-11 NOTE — Progress Notes (Signed)
Name: Timothy Padilla   MRN: [id number]    DOB: 09/25/1991   Date:07/11/2016       Progress Note  Subjective  Chief Complaint  Chief Complaint  Patient presents with  . Annual Exam    and lab work. Pt not fasting today    HPI  Pt. Presents for annual physical exam. He is doing well.   Past Medical History:  Diagnosis Date  . Hypertension     Past Surgical History:  Procedure Laterality Date  . SCAPHOID FRACTURE SURGERY Left 2012    Family History  Problem Relation Age of Onset  . Hypertension Father   . Heart attack Maternal Grandmother   . Heart attack Paternal Grandmother   . Hypertension Paternal Grandmother     Social History   Social History  . Marital status: Single    Spouse name: N/A  . Number of children: N/A  . Years of education: N/A   Occupational History  . Not on file.   Social History Main Topics  . Smoking status: Current Every Day Smoker    Packs/day: 2.00    Years: 9.00    Types: Cigarettes  . Smokeless tobacco: Former NeurosurgeonUser    Types: Snuff     Comment: just tried smokeless 2 times, does not currently use  . Alcohol use 0.0 oz/week     Comment: occasional  . Drug use: No  . Sexual activity: Yes    Partners: Female    Birth control/ protection: Condom   Other Topics Concern  . Not on file   Social History Narrative  . No narrative on file     Current Outpatient Prescriptions:  .  amLODipine (NORVASC) 2.5 MG tablet, Take 1 tablet (2.5 mg total) by mouth daily., Disp: 90 tablet, Rfl: 1 .  busPIRone (BUSPAR) 7.5 MG tablet, Take 1 tablet (7.5 mg total) by mouth 2 (two) times daily., Disp: 180 tablet, Rfl: 1  No Known Allergies   Review of Systems  Constitutional: Negative for chills, fever and malaise/fatigue.  HENT: Negative for congestion and sore throat.   Eyes: Negative for blurred vision and double vision.  Respiratory: Negative for cough, sputum production and shortness of breath.   Cardiovascular: Negative for chest pain  and palpitations.  Gastrointestinal: Negative for abdominal pain, blood in stool, constipation, diarrhea, nausea and vomiting.  Genitourinary: Negative for dysuria, frequency and hematuria.  Musculoskeletal: Negative for back pain and myalgias.  Skin: Positive for itching (at the belt buckle on the lower abdomen, no rash). Negative for rash.  Neurological: Negative for dizziness and headaches.  Psychiatric/Behavioral: Negative for depression. The patient is not nervous/anxious and does not have insomnia.       Objective  Vitals:   07/11/16 1503  BP: 128/76  Pulse: 87  Resp: 16  Temp: 98.3 F (36.8 C)  SpO2: 98%  Weight: 197 lb (89.4 kg)  Height: 5\' 9"  (1.753 m)    Physical Exam  Constitutional: He is oriented to person, place, and time and well-developed, well-nourished, and in no distress.  HENT:  Head: Normocephalic and atraumatic.  Right Ear: No drainage.  Left Ear: Tympanic membrane and ear canal normal.  Mouth/Throat: Oropharynx is clear and moist. No posterior oropharyngeal erythema.  Right ear canal has extra cerumen  Eyes: Conjunctivae are normal. Pupils are equal, round, and reactive to light.  Neck: Neck supple.  Cardiovascular: Normal rate, regular rhythm, S1 normal, S2 normal and normal heart sounds.   Pulmonary/Chest: Effort normal and breath  sounds normal. He has no wheezes. He has no rhonchi.  Abdominal: Soft. Bowel sounds are normal. There is no tenderness.  Musculoskeletal:       Right ankle: He exhibits no swelling.       Left ankle: He exhibits no swelling.  Neurological: He is alert and oriented to person, place, and time.  Skin: Skin is warm, dry and intact.  Psychiatric: Mood, memory, affect and judgment normal.  Nursing note and vitals reviewed.    Assessment & Plan  1. Annual physical exam Obtain age-appropriate laboratory screenings - Lipid Profile - TSH - Vitamin D (25 hydroxy)   Onda Kattner Asad A. [facility] 07/11/2016 3:07 PM

## 2017-03-14 ENCOUNTER — Encounter: Payer: Self-pay | Admitting: Family Medicine

## 2017-03-14 ENCOUNTER — Ambulatory Visit
Admission: RE | Admit: 2017-03-14 | Discharge: 2017-03-14 | Disposition: A | Discharge status: Home / Self Care | Payer: 59 | Source: Ambulatory Visit | Attending: Family Medicine | Admitting: Family Medicine

## 2017-03-14 ENCOUNTER — Ambulatory Visit (INDEPENDENT_AMBULATORY_CARE_PROVIDER_SITE_OTHER): Payer: 59 | Admitting: Family Medicine

## 2017-03-14 VITALS — BP 124/70 | HR 91 | Temp 98.4°F | Resp 17 | Ht 69.0 in | Wt 173.4 lb

## 2017-03-14 DIAGNOSIS — M62830 Muscle spasm of back: Secondary | ICD-10-CM | POA: Insufficient documentation

## 2017-03-14 DIAGNOSIS — R937 Abnormal findings on diagnostic imaging of other parts of musculoskeletal system: Secondary | ICD-10-CM

## 2017-03-14 DIAGNOSIS — M546 Pain in thoracic spine: Secondary | ICD-10-CM | POA: Diagnosis not present

## 2017-03-14 DIAGNOSIS — F17210 Nicotine dependence, cigarettes, uncomplicated: Secondary | ICD-10-CM

## 2017-03-14 MED ORDER — MELOXICAM 15 MG PO TABS: 15.0000 mg | ORAL_TABLET | Freq: Every day | ORAL | 0 refills | Status: DC

## 2017-03-14 NOTE — Patient Instructions (Addendum)
Back Exercises If you have pain in your back, do these exercises 2-3 times each day or as told by your doctor. When the pain goes away, do the exercises once each day, but repeat the steps more times for each exercise (do more repetitions). If you do not have pain in your back, do these exercises once each day or as told by your doctor. Exercises Single Knee to Chest   Do these steps 3-5 times in a row for each leg: 1. Lie on your back on a firm bed or the floor with your legs stretched out. 2. Bring one knee to your chest. 3. Hold your knee to your chest by grabbing your knee or thigh. 4. Pull on your knee until you feel a gentle stretch in your lower back. 5. Keep doing the stretch for 10-30 seconds. 6. Slowly let go of your leg and straighten it. Pelvic Tilt   Do these steps 5-10 times in a row: 1. Lie on your back on a firm bed or the floor with your legs stretched out. 2. Bend your knees so they point up to the ceiling. Your feet should be flat on the floor. 3. Tighten your lower belly (abdomen) muscles to press your lower back against the floor. This will make your tailbone point up to the ceiling instead of pointing down to your feet or the floor. 4. Stay in this position for 5-10 seconds while you gently tighten your muscles and breathe evenly. Cat-Cow   Do these steps until your lower back bends more easily: 1. Get on your hands and knees on a firm surface. Keep your hands under your shoulders, and keep your knees under your hips. You may put padding under your knees. 2. Let your head hang down, and make your tailbone point down to the floor so your lower back is round like the back of a cat. 3. Stay in this position for 5 seconds. 4. Slowly lift your head and make your tailbone point up to the ceiling so your back hangs low (sags) like the back of a cow. 5. Stay in this position for 5 seconds. Press-Ups   Do these steps 5-10 times in a row: 1. Lie on your belly (face-down) on  the floor. 2. Place your hands near your head, about shoulder-width apart. 3. While you keep your back relaxed and keep your hips on the floor, slowly straighten your arms to raise the top half of your body and lift your shoulders. Do not use your back muscles. To make yourself more comfortable, you may change where you place your hands. 4. Stay in this position for 5 seconds. 5. Slowly return to lying flat on the floor. Bridges   Do these steps 10 times in a row: 1. Lie on your back on a firm surface. 2. Bend your knees so they point up to the ceiling. Your feet should be flat on the floor. 3. Tighten your butt muscles and lift your butt off of the floor until your waist is almost as high as your knees. If you do not feel the muscles working in your butt and the back of your thighs, slide your feet 1-2 inches farther away from your butt. 4. Stay in this position for 3-5 seconds. 5. Slowly lower your butt to the floor, and let your butt muscles relax. If this exercise is too easy, try doing it with your arms crossed over your chest. Belly Crunches   Do these steps 5-10 times   in a row: 1. Lie on your back on a firm bed or the floor with your legs stretched out. 2. Bend your knees so they point up to the ceiling. Your feet should be flat on the floor. 3. Cross your arms over your chest. 4. Tip your chin a little bit toward your chest but do not bend your neck. 5. Tighten your belly muscles and slowly raise your chest just enough to lift your shoulder blades a tiny bit off of the floor. 6. Slowly lower your chest and your head to the floor. Back Lifts  Do these steps 5-10 times in a row: 1. Lie on your belly (face-down) with your arms at your sides, and rest your forehead on the floor. 2. Tighten the muscles in your legs and your butt. 3. Slowly lift your chest off of the floor while you keep your hips on the floor. Keep the back of your head in line with the curve in your back. Look at the  floor while you do this. 4. Stay in this position for 3-5 seconds. 5. Slowly lower your chest and your face to the floor. Contact a doctor if:  Your back pain gets a lot worse when you do an exercise.  Your back pain does not lessen 2 hours after you exercise. If you have any of these problems, stop doing the exercises. Do not do them again unless your doctor says it is okay. Get help right away if:  You have sudden, very bad back pain. If this happens, stop doing the exercises. Do not do them again unless your doctor says it is okay. This information is not intended to replace advice given to you by your health care provider. Make sure you discuss any questions you have with your health care provider. Document Released: 11/24/2010 Document Revised: 03/29/2016 Document Reviewed: 12/16/2014 Elsevier Interactive Patient Education  2017 [employer]. Varenicline oral tablets What is this medicine? VARENICLINE (var EN i kleen) is used to help people quit smoking. It can reduce the symptoms caused by stopping smoking. It is used with a patient support program recommended by your physician. This medicine may be used for other purposes; ask your health care provider or pharmacist if you have questions. COMMON BRAND NAME(S): Chantix What should I tell my health care provider before I take this medicine? They need to know if you have any of these conditions: -bipolar disorder, depression, schizophrenia or other mental illness -heart disease -if you often drink alcohol -kidney disease -peripheral vascular disease -seizures -stroke -suicidal thoughts, plans, or attempt; a previous suicide attempt by you or a family member -an unusual or allergic reaction to varenicline, other medicines, foods, dyes, or preservatives -pregnant or trying to get pregnant -breast-feeding How should I use this medicine? Take this medicine by mouth after eating. Take with a full glass of water. Follow the  directions on the prescription label. Take your doses at regular intervals. Do not take your medicine more often than directed. There are 3 ways you can use this medicine to help you quit smoking; talk to your health care professional to decide which plan is right for you: 1) you can choose a quit date and start this medicine 1 week before the quit date, or, 2) you can start taking this medicine before you choose a quit date, and then pick a quit date between day 8 and 35 days of treatment, or, 3) if you are not sure that you are able or willing to  quit smoking right away, start taking this medicine and slowly decrease the amount you smoke as directed by your health care professional with the goal of being cigarette-free by week 12 of treatment. Stick to your plan; ask about support groups or other ways to help you remain cigarette-free. If you are motivated to quit smoking and did not succeed during a previous attempt with this medicine for reasons other than side effects, or if you returned to smoking after this treatment, speak with your health care professional about whether another course of this medicine may be right for you. A special MedGuide will be given to you by the pharmacist with each prescription and refill. Be sure to read this information carefully each time. Talk to your pediatrician regarding the use of this medicine in children. This medicine is not approved for use in children. Overdosage: If you think you have taken too much of this medicine contact a poison control center or emergency room at once. NOTE: This medicine is only for you. Do not share this medicine with others. What if I miss a dose? If you miss a dose, take it as soon as you can. If it is almost time for your next dose, take only that dose. Do not take double or extra doses. What may interact with this medicine? -alcohol or any product that contains alcohol -insulin -other stop smoking  aids -theophylline -warfarin This list may not describe all possible interactions. Give your health care provider a list of all the medicines, herbs, non-prescription drugs, or dietary supplements you use. Also tell them if you smoke, drink alcohol, or use illegal drugs. Some items may interact with your medicine. What should I watch for while using this medicine? Visit your doctor or health care professional for regular check ups. Ask for ongoing advice and encouragement from your doctor or healthcare professional, friends, and family to help you quit. If you smoke while on this medication, quit again Your mouth may get dry. Chewing sugarless gum or sucking hard candy, and drinking plenty of water may help. Contact your doctor if the problem does not go away or is severe. You may get drowsy or dizzy. Do not drive, use machinery, or do anything that needs mental alertness until you know how this medicine affects you. Do not stand or sit up quickly, especially if you are an older patient. This reduces the risk of dizzy or fainting spells. Sleepwalking can happen during treatment with this medicine, and can sometimes lead to behavior that is harmful to you, other people, or property. Stop taking this medicine and tell your doctor if you start sleepwalking or have other unusual sleep-related activity. Decrease the amount of alcoholic beverages that you drink during treatment with this medicine until you know if this medicine affects your ability to tolerate alcohol. Some people have experienced increased drunkenness (intoxication), unusual or sometimes aggressive behavior, or no memory of things that have happened (amnesia) during treatment with this medicine. The use of this medicine may increase the chance of suicidal thoughts or actions. Pay special attention to how you are responding while on this medicine. Any worsening of mood, or thoughts of suicide or dying should be reported to your health care  professional right away. What side effects may I notice from receiving this medicine? Side effects that you should report to your doctor or health care professional as soon as possible: -allergic reactions like skin rash, itching or hives, swelling of the face, lips, tongue, or throat -acting  aggressive, being angry or violent, or acting on dangerous impulses -breathing problems -changes in vision -chest pain or chest tightness -confusion, trouble speaking or understanding -new or worsening depression, anxiety, or panic attacks -extreme increase in activity and talking (mania) -fast, irregular heartbeat -feeling faint or lightheaded, falls -fever -pain in legs when walking -problems with balance, talking, walking -redness, blistering, peeling or loosening of the skin, including inside the mouth -ringing in ears -seeing or hearing things that aren't there (hallucinations) -seizures -sleepwalking -sudden numbness or weakness of the face, arm or leg -thoughts about suicide or dying, or attempts to commit suicide -trouble passing urine or change in the amount of urine -unusual bleeding or bruising -unusually weak or tired Side effects that usually do not require medical attention (report to your doctor or health care professional if they continue or are bothersome): -constipation -headache -nausea, vomiting -strange dreams -stomach gas -trouble sleeping This list may not describe all possible side effects. Call your doctor for medical advice about side effects. You may report side effects to FDA at 1-800-FDA-1088. Where should I keep my medicine? Keep out of the reach of children. Store at room temperature between 15 and 30 degrees C (59 and 86 degrees F). Throw away any unused medicine after the expiration date. NOTE: This sheet is a summary. It may not cover all possible information. If you have questions about this medicine, talk to your doctor, pharmacist, or health care  provider.  2018 Elsevier/Gold Standard (2015-07-07 16:14:23)

## 2017-03-14 NOTE — Progress Notes (Addendum)
Name: Timothy Padilla   MRN: [id number]    DOB: 02/11/1991   Date:03/14/2017       Progress Note  Subjective  Chief Complaint  Chief Complaint  Patient presents with  . Back Pain    mid back under shoulder blade x3 weeks    HPI   Pain starts just left of the thoracic spine with radiation to left arm numbness/tingling. Pt denies weakness, but notes that if pain lasts for extended period of time, it is difficult to continue working. No leg numbness/tingling. 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  He has not been taking Amlodipine or Buspar as prescribed.  He has tried Aleve without relief of pain.  Nicotine Dependence: Patient has cut down to 1.25ppd and requests information on Chantix.  He has been doing well decreasing his usage by himself, but feels like he needs medication assistance to completely stop.  He states he is not ready for a prescription at this time, but would like some reading material on Chantix.  Patient Active Problem List   Diagnosis Date Noted  . Annual physical exam 07/11/2016  . Stress at work 03/27/2016    Social History  Substance Use Topics  . Smoking status: Current Every Day Smoker    Packs/day: 2.00    Years: 9.00    Types: Cigarettes  . Smokeless tobacco: Former NeurosurgeonUser    Types: Snuff     Comment: just tried smokeless 2 times, does not currently use  . Alcohol use 0.0 oz/week     Comment: occasional     Current Outpatient Prescriptions:  .  meloxicam (MOBIC) 15 MG tablet, Take 1 tablet (15 mg total) by mouth daily. Take with food., Disp: 30 tablet, Rfl: 0  No Known Allergies  ROS  Constitutional: Negative for fever or weight change.  Respiratory: Negative for cough and shortness of breath.   Cardiovascular: Negative for chest pain or palpitations.  Gastrointestinal: Negative for abdominal pain, no bowel changes.  Musculoskeletal: Negative for gait problem or joint swelling.  Skin: Negative  for rash.  Neurological: Negative for dizziness or headache.  No other specific complaints in a complete review of systems (except as listed in HPI above).  Objective  Vitals:   03/14/17 0902  BP: 124/70  Pulse: 91  Resp: 17  Temp: 98.4 F (36.9 C)  TempSrc: Oral  SpO2: 98%  Weight: 173 lb 6.4 oz (78.7 kg)  Height: 5\' 9"  (1.753 m)    Body mass index is 25.61 kg/m.  Nursing Note and Vital Signs reviewed.  Physical Exam  Musculoskeletal:       Cervical back: He exhibits pain and spasm. He exhibits no bony tenderness, no edema and no deformity.       Back:  Constitutional: Patient appears well-developed and well-nourished.  HEENT: head atraumatic, normocephalic Cardiovascular: Normal rate, regular rhythm, S1/S2 present.  No murmur or rub heard. No BLE edema. Pulmonary/Chest: Effort normal and breath sounds clear. No respiratory distress or retractions. Abdominal: Soft and non-tender, bowel sounds present x4 quadrants. Psychiatric: Patient has a normal mood and affect. behavior is normal. Judgment and thought content normal.  No results found for this or any previous visit (from the past 2160 hour(s)).   Assessment & Plan  1. Spasm of thoracic back muscle - meloxicam (MOBIC) 15 MG tablet; Take 1 tablet (15 mg total) by mouth daily. Take with food.  Dispense: 30 tablet; Refill: 0 - DG  Thoracic Spine 2 View; Future - Discussed risk/benefit of Xrays with patient, and he expresses preference to have Xray performed today. - Discussed back exercises - Will follow up in 1-2 weeks if no improvement and will consider PT or Ortho referral then. -Declines muscle relaxer and wants to try Mobic alone first.  2. Left-sided thoracic back pain, unspecified chronicity - DG Thoracic Spine 2 View; Future  3. Cigarette nicotine dependence without complication -Smoking cessation counseling provided, information regarding medication options including Chantix and Wellbutrin as well as  Nicotine patches and gum discussed with patient. -AVS with information on Chantix provided. -Patient to schedule follow up when he is ready to quit.   -Red flags and when to present for emergency care or RTC including fever >101.30F, chest pain, shortness of breath, new/worsening/un-resolving symptoms, weakness reviewed with patient at time of visit. Follow up and care instructions discussed and provided in AVS. -Reviewed Health Maintenance: Will return for fasting labs as soon as possible, and will schedule an appointment for CPE in September.  I have reviewed this encounter including the documentation in this note and/or discussed this patient with the Deboraha Sprang, FNP, NP-C. I am certifying that I agree with the content of this note as supervising physician.  Alba Cory, MD [facility] 03/14/2017, 2:25 PM

## 2017-03-14 NOTE — Addendum Note (Signed)
Addended by: Maurice SmallBOYCE, Lc Joynt E on: 03/14/2017 01:30 PM   Modules accepted: Orders

## 2017-03-14 NOTE — Progress Notes (Signed)
Please call and let pt know that his Xray shows some demineralization of the bones.  I am placing orders for lab work, and he should come in to have this done when he can.  We may need to send him to a bone specialist depending on lab results. He should proceed with the plan to use Mobic/Meloxicam for pain and perform the back exercises/stretching as tolerated.  Thank you!

## 2017-03-14 NOTE — Progress Notes (Signed)
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 No acute abnormalities. Electronically Signed By: Ulyses SouthwardMark Boles M.D. On: 03/14/2017 12:13  Abnormal x-ray of thoracic spine - Labs placed to be drawn at patient's earliest convenience. - TSH - CBC - COMPLETE METABOLIC PANEL WITH GFR - VITAMIN D 25 Hydroxy (Vit-D Deficiency, Fractures) - Parathyroid hormone, intact (no Ca)

## 2017-03-15 LAB — CBC
HCT: 47.1 % (ref 38.5–50.0)
HEMOGLOBIN: 16.2 g/dL (ref 13.2–17.1)
MCH: 31.5 pg (ref 27.0–33.0)
MCHC: 34.4 g/dL (ref 32.0–36.0)
MCV: 91.5 fL (ref 80.0–100.0)
MPV: 10.7 fL (ref 7.5–12.5)
Platelets: 139 10*3/uL — ABNORMAL LOW (ref 140–400)
RBC: 5.15 MIL/uL (ref 4.20–5.80)
RDW: 13.1 % (ref 11.0–15.0)
WBC: 5.3 10*3/uL (ref 3.8–10.8)

## 2017-03-15 LAB — COMPLETE METABOLIC PANEL WITH GFR
ALBUMIN: 4.4 g/dL (ref 3.6–5.1)
ALK PHOS: 57 U/L (ref 40–115)
ALT: 13 U/L (ref 9–46)
AST: 13 U/L (ref 10–40)
BUN: 16 mg/dL (ref 7–25)
CALCIUM: 9.2 mg/dL (ref 8.6–10.3)
CO2: 24 mmol/L (ref 20–31)
Chloride: 106 mmol/L (ref 98–110)
Creat: 0.81 mg/dL (ref 0.60–1.35)
Glucose, Bld: 86 mg/dL (ref 65–99)
Potassium: 4.1 mmol/L (ref 3.5–5.3)
Sodium: 138 mmol/L (ref 135–146)
Total Bilirubin: 1.3 mg/dL — ABNORMAL HIGH (ref 0.2–1.2)
Total Protein: 7 g/dL (ref 6.1–8.1)

## 2017-03-15 LAB — TSH: TSH: 1.6 mIU/L (ref 0.40–4.50)

## 2017-03-16 LAB — VITAMIN D 25 HYDROXY (VIT D DEFICIENCY, FRACTURES): VIT D 25 HYDROXY: 34 ng/mL (ref 30–100)

## 2017-03-18 ENCOUNTER — Other Ambulatory Visit: Payer: Self-pay | Admitting: Family Medicine

## 2017-03-18 DIAGNOSIS — R937 Abnormal findings on diagnostic imaging of other parts of musculoskeletal system: Secondary | ICD-10-CM

## 2017-03-18 LAB — PARATHYROID HORMONE, INTACT (NO CA): PTH: 33 pg/mL (ref 14–64)

## 2017-03-18 NOTE — Progress Notes (Signed)
Please call the patient - Normal PTH, Vit D, TSH, CBC, and CMP.  I am going to refer him to an endocrinologist so that they can check his bone density due to the findings in his Xray.  If he has any questions, I am happy to speak with him. Thank you!

## 2017-04-19 ENCOUNTER — Telehealth: Payer: Self-pay | Admitting: Family Medicine

## 2017-04-19 NOTE — Telephone Encounter (Signed)
[facility] but when he spoke with them they told him that he was referred to the wrong department that [facility] should have [facility] because it was pertaining to his xray of back. Please fax to [phone]

## 2017-04-19 NOTE — Telephone Encounter (Signed)
Please re enter referral

## 2017-04-22 ENCOUNTER — Telehealth: Payer: Self-pay

## 2017-04-22 DIAGNOSIS — R937 Abnormal findings on diagnostic imaging of other parts of musculoskeletal system: Secondary | ICD-10-CM

## 2017-04-22 NOTE — Telephone Encounter (Signed)
A message was left for this patient to give us a call so that I can thoroughly explain what has happened regarding his referral to [facility].  General 04/05/2017 3:10 PM Mock, [name] - -  Note   Received fax from [facility] on 04/04/17 stating that they will not be seeing patient Timothy Padilla due him not responding to their calls/no showed/or canceled three times.  Please send a new referral once the patient is committed to being seen for his condition.

## 2017-04-22 NOTE — Telephone Encounter (Signed)
A message was left for this patient to give us a call so that I can thoroughly explain what has happened regarding his referral [facility] as listed below  General 04/05/2017 3:10 PM Mock, [name] - -  Note   [facility] 04/04/17 stating that they will not be seeing patient Timothy Padilla due him not responding to their calls/no showed/or canceled three times.  Please send a new referral once the patient is committed to being seen for his condition.

## 2017-04-23 NOTE — Telephone Encounter (Signed)
I call this patient to inform him that he has been scheduled to have his DEXA on Tuesday, April 30, 2017 at 12:45pm [facility] but there was no answer. A message was left for this patient with this information on his voicemail and the number to the [facility] [phone]) in case he needed to reschedule the DEXA.

## 2017-04-23 NOTE — Telephone Encounter (Signed)
After speaking with Dr. Sherryll BurgerShah regarding this patient's case, I have placed an order for a bone density scan instead of referral to endocrinology. If this comes back abnormal, I will replace the referral. Please call and let patient know. Thank you!

## 2017-04-23 NOTE — Addendum Note (Signed)
Addended by: Maurice SmallBOYCE, Kayal Mula E on: 04/23/2017 12:20 PM   Modules accepted: Orders

## 2017-04-30 ENCOUNTER — Ambulatory Visit
Admission: RE | Admit: 2017-04-30 | Discharge: 2017-04-30 | Disposition: A | Discharge status: Home / Self Care | Payer: 59 | Source: Ambulatory Visit | Attending: Family Medicine | Admitting: Family Medicine

## 2017-04-30 ENCOUNTER — Ambulatory Visit
Admission: EM | Admit: 2017-04-30 | Discharge: 2017-04-30 | Disposition: A | Discharge status: Home / Self Care | Payer: 59 | Attending: Emergency Medicine | Admitting: Emergency Medicine

## 2017-04-30 ENCOUNTER — Encounter: Payer: Self-pay | Admitting: Gynecology

## 2017-04-30 DIAGNOSIS — R937 Abnormal findings on diagnostic imaging of other parts of musculoskeletal system: Secondary | ICD-10-CM | POA: Diagnosis present

## 2017-04-30 DIAGNOSIS — R29890 Loss of height: Secondary | ICD-10-CM | POA: Diagnosis not present

## 2017-04-30 DIAGNOSIS — J0100 Acute maxillary sinusitis, unspecified: Secondary | ICD-10-CM | POA: Diagnosis not present

## 2017-04-30 DIAGNOSIS — J069 Acute upper respiratory infection, unspecified: Secondary | ICD-10-CM

## 2017-04-30 MED ORDER — AMOXICILLIN-POT CLAVULANATE 875-125 MG PO TABS: 1.0000 | ORAL_TABLET | Freq: Two times a day (BID) | ORAL | 0 refills | Status: DC

## 2017-04-30 MED ORDER — BENZONATATE 100 MG PO CAPS: 100.0000 mg | ORAL_CAPSULE | Freq: Three times a day (TID) | ORAL | 0 refills | Status: DC | PRN

## 2017-04-30 MED ORDER — HYDROCOD POLST-CPM POLST ER 10-8 MG/5ML PO SUER: 5.0000 mL | Freq: Every evening | ORAL | 0 refills | Status: DC | PRN

## 2017-04-30 NOTE — Discharge Instructions (Signed)
Take medication as prescribed. Rest. Drink plenty of fluids. If symptoms continue past 3 more days then start antibiotic as discussed.   Follow up with your primary care physician this week as needed. Return to Urgent care for new or worsening concerns.

## 2017-04-30 NOTE — ED Triage Notes (Signed)
Per patient sinus congestion x 6 days.

## 2017-04-30 NOTE — Progress Notes (Signed)
Please call patient - normal bone density scan. No further action required at this time. Thanks!

## 2017-04-30 NOTE — ED Provider Notes (Signed)
[facility] ____________________________________________  Time seen: Approximately 3:23 PM  I have reviewed the triage vital signs and the nursing notes.   HISTORY  Chief Complaint Sinusitis   HPI Timothy Padilla is a 26 y.o. male presenting for evaluation of 6 days of runny nose, nasal congestion and sinus pressure. Reports cough with associated postnasal drainage. States cough recently with similar but night. Reports symptoms have been unresolved with over-the-counter Sudafed and congestion medicines. Patient denies fevers. Reports his any very thick yellow drainage out with blowing nose. States some mucus production with coughing. Denies recent sickness. Denies known sick contacts. Reports occasional allergy issues. Does not feel like this currently is the situation. Denies recent sickness for himself. Reports continues to eat and drink well. States occasional scratchy sore throat, worse in the mornings. Has continued to remain active. Denies other complaints.  Denies chest pain, shortness of breath, abdominal pain, dysuria, extremity pain, extremity swelling or rash. Denies recent sickness. Denies recent antibiotic use.    Past Medical History:  Diagnosis Date  . Hypertension     Patient Active Problem List   Diagnosis Date Noted  . Annual physical exam 07/11/2016  . Stress at work 03/27/2016    Past Surgical History:  Procedure Laterality Date  . SCAPHOID FRACTURE SURGERY Left 2012     No current facility-administered medications for this encounter.   Current Outpatient Prescriptions:  .  amoxicillin-clavulanate (AUGMENTIN) 875-125 MG tablet, Take 1 tablet by mouth every 12 (twelve) hours., Disp: 20 tablet, Rfl: 0 .  benzonatate (TESSALON PERLES) 100 MG capsule, Take 1 capsule (100 mg total) by mouth 3 (three) times daily as needed for cough., Disp: 15 capsule, Rfl: 0 .  chlorpheniramine-HYDROcodone (TUSSIONEX PENNKINETIC ER) 10-8 MG/5ML SUER, Take 5 mLs by  mouth at bedtime as needed for cough. do not drive or operate machinery while taking as can cause drowsiness., Disp: 75 mL, Rfl: 0 .  meloxicam (MOBIC) 15 MG tablet, Take 1 tablet (15 mg total) by mouth daily. Take with food., Disp: 30 tablet, Rfl: 0  Allergies Patient has no known allergies.  Family History  Problem Relation Age of Onset  . Hypertension Father   . Heart attack Maternal Grandmother   . Heart attack Paternal Grandmother   . Hypertension Paternal Grandmother     Social History Social History  Substance Use Topics  . Smoking status: Current Every Day Smoker    Packs/day: 2.00    Years: 9.00    Types: Cigarettes  . Smokeless tobacco: Former Neurosurgeon    Types: Snuff     Comment: just tried smokeless 2 times, does not currently use  . Alcohol use 0.0 oz/week     Comment: occasional    Review of Systems Constitutional: No fever/chills Eyes: No visual changes. ENT:  As above.  Cardiovascular: Denies chest pain. Respiratory: Denies shortness of breath. Gastrointestinal: No abdominal pain.  No nausea, no vomiting.  No diarrhea.  No constipation. Genitourinary: Negative for dysuria. Musculoskeletal: Negative for back pain. Skin: Negative for rash.  ____________________________________________   PHYSICAL EXAM:  VITAL SIGNS: ED Triage Vitals  Enc Vitals Group     BP 04/30/17 1452 119/67     Pulse Rate 04/30/17 1452 69     Resp 04/30/17 1452 16     Temp 04/30/17 1452 98.1 F (36.7 C)     Temp Source 04/30/17 1452 Oral     SpO2 04/30/17 1452 100 %     Weight 04/30/17 1450 160 lb (72.6 kg)  Height 04/30/17 1450 5\' 8"  (1.727 m)     Head Circumference --      Peak Flow --      Pain Score 04/30/17 1451 5     Pain Loc --      Pain Edu? --      Excl. in GC? --    Constitutional: Alert and oriented. Well appearing and in no acute distress. Eyes: Conjunctivae are normal. Head: Atraumatic.Mild tenderness to palpation bilateral maxillary sinuses, No frontal  sinus tenderness to palpation.. No swelling. No erythema.   Ears: no erythema, normal TMs bilaterally.   Nose: nasal congestion with bilateral nasal turbinate erythema and edema.   Mouth/Throat: Mucous membranes are moist.  Oropharynx non-erythematous.No tonsillar swelling or exudate. Drainage present in posterior pharynx. Neck: No stridor.  No cervical spine tenderness to palpation. Hematological/Lymphatic/Immunilogical: No cervical lymphadenopathy. Cardiovascular: Normal rate, regular rhythm. Grossly normal heart sounds.  Good peripheral circulation. Respiratory: Normal respiratory effort.  No retractions. No wheezes, rales or rhonchi. Good air movement.  Musculoskeletal:No cervical, thoracic or lumbar tenderness to palpation.  Neurologic:  Normal speech and language.No gait instability. Skin:  Skin is warm, dry Psychiatric: Mood and affect are normal. Speech and behavior are normal.  ___________________________________________   LABS (all labs ordered are listed, but only abnormal results are displayed)  Labs Reviewed - No data to display  PROCEDURES Procedures     INITIAL IMPRESSION / ASSESSMENT AND PLAN / ED COURSE  Pertinent labs & imaging results that were available during my care of the patient were reviewed by me and considered in my medical decision making (see chart for details).  Very well-appearing patient. No acute distress. Discussed in detail with patient suspect sinusitis, concern continues to be a viral sinusitis. Encouraged supportive care. Will treat patient with over-the-counter Sudafed and Claritin, urine Tessalon Perles and when necessary Tussionex. Encourage rest, fluids and supportive care. Discussed the patient's symptoms continue past 3 more days, then to fill prescription for Augmentin. Discussed sooner follow-up for any worsening concerns, fevers, other complaints.Discussed indication, risks and benefits of medications with patient.  Discussed follow up  with Primary care physician this week. Discussed follow up and return parameters including no resolution or any worsening concerns. Patient verbalized understanding and agreed to plan.   ____________________________________________   FINAL CLINICAL IMPRESSION(S) / ED DIAGNOSES  Final diagnoses:  Acute maxillary sinusitis, recurrence not specified  Upper respiratory tract infection, unspecified type     Discharge Medication List as of 04/30/2017  3:36 PM    START taking these medications   Details  amoxicillin-clavulanate (AUGMENTIN) 875-125 MG tablet Take 1 tablet by mouth every 12 (twelve) hours., Starting Tue 04/30/2017, Print    benzonatate (TESSALON PERLES) 100 MG capsule Take 1 capsule (100 mg total) by mouth 3 (three) times daily as needed for cough., Starting Tue 04/30/2017, Normal    chlorpheniramine-HYDROcodone (TUSSIONEX PENNKINETIC ER) 10-8 MG/5ML SUER Take 5 mLs by mouth at bedtime as needed for cough. do not drive or operate machinery while taking as can cause drowsiness., Starting Tue 04/30/2017, Print        Note: This dictation was prepared with Dragon dictation along with smaller phrase technology. Any transcriptional errors that result from this process are unintentional.         Renford DillsMiller, Jervis Trapani, NP 04/30/17 1609

## 2018-01-06 ENCOUNTER — Encounter: Payer: Self-pay | Admitting: Family Medicine

## 2018-01-06 ENCOUNTER — Ambulatory Visit (INDEPENDENT_AMBULATORY_CARE_PROVIDER_SITE_OTHER): Payer: 59 | Admitting: Family Medicine

## 2018-01-06 VITALS — BP 134/86 | HR 107 | Temp 98.6°F | Resp 18 | Ht 69.0 in | Wt 188.2 lb

## 2018-01-06 DIAGNOSIS — Z23 Encounter for immunization: Secondary | ICD-10-CM | POA: Diagnosis not present

## 2018-01-06 DIAGNOSIS — R937 Abnormal findings on diagnostic imaging of other parts of musculoskeletal system: Secondary | ICD-10-CM | POA: Diagnosis not present

## 2018-01-06 DIAGNOSIS — Z720 Tobacco use: Secondary | ICD-10-CM | POA: Diagnosis not present

## 2018-01-06 DIAGNOSIS — R030 Elevated blood-pressure reading, without diagnosis of hypertension: Secondary | ICD-10-CM

## 2018-01-06 MED ORDER — NICOTINE 21 MG/24HR TD PT24: 21.0000 mg | MEDICATED_PATCH | Freq: Every day | TRANSDERMAL | 0 refills | Status: DC

## 2018-01-06 MED ORDER — BUPROPION HCL ER (SR) 150 MG PO TB12: ORAL_TABLET | ORAL | 0 refills | Status: DC

## 2018-01-06 NOTE — Progress Notes (Signed)
BP 134/86 (BP Location: Right Arm, Cuff Size: Large)   Pulse (!) 107   Temp 98.6 F (37 C) (Oral)   Resp 18   Ht 5\' 9"  (1.753 m)   Wt 188 lb 3.2 oz (85.4 kg)   SpO2 98%   BMI 27.79 kg/m    Subjective:    Patient ID: Timothy Padilla, male    DOB: 03/14/1991, 27 y.o.   MRN: [id number]  HPI: Timothy Padilla is a 27 y.o. male  Chief Complaint  Patient presents with  . Nicotine Dependence    Would like to discuss quitting-has smoked for the past 14 years     HPI Patient is here as a new patient to me; his previous provider left the practice He has been smoking for 14 years and would like to quit Grew up in smoking household Everybody at work except one co-worker smokes Has consider cold [place]; has tried a little before, and got down to 6 cigs a day, but started feeling really sick; vision was blurry; irritable After waking, first cigarette is on the ride to work, maybe one hour after waking; last cig at night before bed is 30 minutes before bed; tries to do a few things before bed; waking up in the middle of the night, hip bothers him or temperature issue; does smoke sometimes in the middle of the night Might get 5-6 hours of sleep nightly Drinks lots of [facility] No concerns about ADHD he says He has not really looked into the med 1.5 ppd right now  Elevated blood pressure; had some hot dogs earlier today; [facility]; father has HTN; stress preeviously  He had a DEXA scan done June 2018; normal; he does recall old trauma He had thoracic spine xray done May 2018: IMPRESSION: Minimal anterior height losses of T10 and T11 appear old.  No acute abnormalities.  Electronically Signed   By: Ulyses SouthwardMark  Boles M.D.   On: 03/14/2017 12:13  Dep[facility] screen [facility] 2/9 01/06/2018 03/14/2017 07/11/2016 04/27/2016 03/27/2016  Decreased Interest 0 0 0 0 0  Down, Depressed, Hopeless 0 0 0 0 0  PHQ - 2 Score 0 0 0 0 0    Relevant past medical, surgical, family and social history reviewed Past  Medical History:  Diagnosis Date  . Hypertension    Past Surgical History:  Procedure Laterality Date  . SCAPHOID FRACTURE SURGERY Left 2012   Family History  Problem Relation Age of Onset  . Hypertension Father   . Heart attack Maternal Grandmother   . Hypertension Paternal Grandmother   . Stroke Paternal Grandmother   . COPD Paternal Grandfather    Social History   Tobacco Use  . Smoking status: Current Every Day Smoker    Packs/day: 1.50    Years: 14.00    Pack years: 21.00    Types: Cigarettes    Start date: 01/07/2004  . Smokeless tobacco: Former NeurosurgeonUser    Types: Snuff  . Tobacco comment: just tried smokeless 2 times, does not currently use  Substance Use Topics  . Alcohol use: No    Alcohol/week: 0.0 oz    Frequency: Never  . Drug use: No    Interim medical history since last visit reviewed. Allergies and medications reviewed  Review of Systems Per HPI unless specifically indicated above     Objective:    BP 134/86 (BP Location: Right Arm, Cuff Size: Large)   Pulse (!) 107   Temp 98.6 F (37 C) (Oral)  Resp 18   Ht 5\' 9"  (1.753 m)   Wt 188 lb 3.2 oz (85.4 kg)   SpO2 98%   BMI 27.79 kg/m   Wt Readings from Last 3 Encounters:  01/06/18 188 lb 3.2 oz (85.4 kg)  04/30/17 160 lb (72.6 kg)  03/14/17 173 lb 6.4 oz (78.7 kg)    Physical Exam  Constitutional: He appears well-developed and well-nourished. No distress.  Eyes: No scleral icterus.  Cardiovascular: Normal rate and regular rhythm.  Pulmonary/Chest: Effort normal and breath sounds normal.  Musculoskeletal:       Thoracic back: He exhibits no deformity.  Neurological: He is alert.  Skin: No pallor.  Psychiatric: He has a normal mood and affect. His mood appears not anxious. He does not exhibit a depressed mood.  Good eye contact with examiner      Assessment & Plan:   Problem List Items Addressed This Visit      Other   Tobacco abuse - Primary (Chronic)    More than 3 minutes spent  counseling on smoking cessation; harms of smoking, strategies to quit, medication options available; he opted for wellbutrin; see AVS; close f/u      Elevated blood pressure reading    Discussed BP today; DASH guidelines, smoking cessation; close f/u      Abnormal x-ray of thoracic spine    With normal DEXA; patient does recall remote trauma       Other Visit Diagnoses    Need for immunization against influenza       Relevant Orders   Flu Vaccine QUAD 6+ mos PF IM (Fluarix Quad PF)       Follow up plan: Return in about 4 weeks (around 02/03/2018).  An after-visit summary was printed and given to the patient at check-out.  Please see the patient instructions which may contain other information and recommendations beyond what is mentioned above in the assessment and plan.  Meds ordered this encounter  Medications  . buPROPion (WELLBUTRIN SR) 150 MG 12 hr tablet    Sig: One by mouth every morning for 3 days, then one twice a day (2nd pill no later than 3 or 4 pm)    Dispense:  53 tablet    Refill:  0  . nicotine (NICODERM CQ - DOSED IN MG/24 HOURS) 21 mg/24hr patch    Sig: Place 1 patch (21 mg total) onto the skin daily.    Dispense:  14 patch    Refill:  0    Orders Placed This Encounter  Procedures  . Flu Vaccine QUAD 6+ mos PF IM (Fluarix Quad PF)

## 2018-01-06 NOTE — Patient Instructions (Addendum)
Steps to Quit Smoking Smoking tobacco can be bad for your health. It can also affect almost every organ in your body. Smoking puts you and people around you at risk for many serious long-lasting (chronic) diseases. Quitting smoking is hard, but it is one of the best things that you can do for your health. It is never too late to quit. What are the benefits of quitting smoking? When you quit smoking, you lower your risk for getting serious diseases and conditions. They can include:  Lung cancer or lung disease.  Heart disease.  Stroke.  Heart attack.  Not being able to have children (infertility).  Weak bones (osteoporosis) and broken bones (fractures).  If you have coughing, wheezing, and shortness of breath, those symptoms may get better when you quit. You may also get sick less often. If you are pregnant, quitting smoking can help to lower your chances of having a baby of low birth weight. What can I do to help me quit smoking? Talk with your doctor about what can help you quit smoking. Some things you can do (strategies) include:  Quitting smoking totally, instead of slowly cutting back how much you smoke over a period of time.  Going to in-person counseling. You are more likely to quit if you go to many counseling sessions.  Using resources and support systems, such as: ? Agricultural engineer with a Veterinary surgeon. ? Phone quitlines. ? Automotive engineer. ? Support groups or group counseling. ? Text messaging programs. ? Mobile phone apps or applications.  Taking medicines. Some of these medicines may have nicotine in them. If you are pregnant or breastfeeding, do not take any medicines to quit smoking unless your doctor says it is okay. Talk with your doctor about counseling or other things that can help you.  Talk with your doctor about using more than one strategy at the same time, such as taking medicines while you are also going to in-person counseling. This can help  make quitting easier. What things can I do to make it easier to quit? Quitting smoking might feel very hard at first, but there is a lot that you can do to make it easier. Take these steps:  Talk to your family and friends. Ask them to support and encourage you.  Call phone quitlines, reach out to support groups, or work with a Veterinary surgeon.  Ask people who smoke to not smoke around you.  Avoid places that make you want (trigger) to smoke, such as: ? Bars. ? Parties. ? Smoke-break areas at work.  Spend time with people who do not smoke.  Lower the stress in your life. Stress can make you want to smoke. Try these things to help your stress: ? Getting regular exercise. ? Deep-breathing exercises. ? Yoga. ? Meditating. ? Doing a body scan. To do this, close your eyes, focus on one area of your body at a time from head to toe, and notice which parts of your body are tense. Try to relax the muscles in those areas.  Download or buy apps on your mobile phone or tablet that can help you stick to your quit plan. There are many free apps, such as QuitGuide from the [employer] developer for Disease Control and Prevention). You can find more support from smokefree.gov and other websites.  This information is not intended to replace advice given to you by your health care provider. Make sure you discuss any questions you have with your health care provider. Document Released:  08/18/2009 Document Revised: 06/19/2016 Document Reviewed: 03/08/2015 Elsevier Interactive Patient Education  2018 [employer]  Coping with Quitting Smoking Quitting smoking is a physical and mental challenge. You will face cravings, withdrawal symptoms, and temptation. Before quitting, work with your health care provider to make a plan that can help you cope. Preparation can help you quit and keep you from giving in. How can I cope with cravings? Cravings usually last for 5-10 minutes. If you get through it, the craving will pass.  Consider taking the following actions to help you cope with cravings:  Keep your mouth busy: ? Chew sugar-free gum. ? Suck on hard candies or a straw. ? Brush your teeth.  Keep your hands and body busy: ? Immediately change to a different activity when you feel a craving. ? Squeeze or play with a ball. ? Do an activity or a hobby, like making bead jewelry, practicing needlepoint, or working with wood. ? Mix up your normal routine. ? Take a short exercise break. Go for a quick walk or run up and down stairs. ? Spend time in public places where smoking is not allowed.  Focus on doing something kind or helpful for someone else.  Call a friend or family member to talk during a craving.  Join a support group.  Call a quit line, such as 1-800-QUIT-NOW.  Talk with your health care provider about medicines that might help you cope with cravings and make quitting easier for you.  How can I deal with withdrawal symptoms? Your body may experience negative effects as it tries to get used to not having nicotine in the system. These effects are called withdrawal symptoms. They may include:  Feeling hungrier than normal.  Trouble concentrating.  Irritability.  Trouble sleeping.  Feeling depressed.  Restlessness and agitation.  Craving a cigarette.  To manage withdrawal symptoms:  Avoid places, people, and activities that trigger your cravings.  Remember why you want to quit.  Get plenty of sleep.  Avoid coffee and other caffeinated drinks. These may worsen some of your symptoms.  How can I handle social situations? Social situations can be difficult when you are quitting smoking, especially in the first few weeks. To manage this, you can:  Avoid parties, bars, and other social situations where people might be smoking.  Avoid alcohol.  Leave right away if you have the urge to smoke.  Explain to your family and friends that you are quitting smoking. Ask for understanding  and support.  Plan activities with friends or family where smoking is not an option.  What are some ways I can cope with stress? Wanting to smoke may cause stress, and stress can make you want to smoke. Find ways to manage your stress. Relaxation techniques can help. For example:  Breathe slowly and deeply, in through your nose and out through your mouth.  Listen to soothing, relaxing music.  Talk with a family member or friend about your stress.  Light a candle.  Soak in a bath or take a shower.  Think about a peaceful place.  What are some ways I can prevent weight gain? Be aware that many people gain weight after they quit smoking. However, not everyone does. To keep from gaining weight, have a plan in place before you quit and stick to the plan after you quit. Your plan should include:  Having healthy snacks. When you have a craving, it may help to: ? Eat plain popcorn, crunchy carrots, celery, or other cut vegetables. ?  Chew sugar-free gum.  Changing how you eat: ? Eat small portion sizes at meals. ? Eat 4-6 small meals throughout the day instead of 1-2 large meals a day. ? Be mindful when you eat. Do not watch television or do other things that might distract you as you eat.  Exercising regularly: ? Make time to exercise each day. If you do not have time for a long workout, do short bouts of exercise for 5-10 minutes several times a day. ? Do some form of strengthening exercise, like weight lifting, and some form of aerobic exercise, like running or swimming.  Drinking plenty of water or other low-calorie or no-calorie drinks. Drink 6-8 glasses of water daily, or as much as instructed by your health care provider.  Summary  Quitting smoking is a physical and mental challenge. You will face cravings, withdrawal symptoms, and temptation to smoke again. Preparation can help you as you go through these challenges.  You can cope with cravings by keeping your mouth busy (such  as by chewing gum), keeping your body and hands busy, and making calls to family, friends, or a helpline for people who want to quit smoking.  You can cope with withdrawal symptoms by avoiding places where people smoke, avoiding drinks with caffeine, and getting plenty of rest.  Ask your health care provider about the different ways to prevent weight gain, avoid stress, and handle social situations. This information is not intended to replace advice given to you by your health care provider. Make sure you discuss any questions you have with your health care provider. Document Released: 10/19/2016 Document Revised: 10/19/2016 Document Reviewed: 10/19/2016 Elsevier Interactive Patient Education  2018 [employer].  DASH Eating Plan DASH stands for "Dietary Approaches to Stop Hypertension." The DASH eating plan is a healthy eating plan that has been shown to reduce high blood pressure (hypertension). It may also reduce your risk for type 2 diabetes, heart disease, and stroke. The DASH eating plan may also help with weight loss. What are tips for following this plan? General guidelines  Avoid eating more than 2,300 mg (milligrams) of salt (sodium) a day. If you have hypertension, you may need to reduce your sodium intake to 1,500 mg a day.  Limit alcohol intake to no more than 1 drink a day for nonpregnant women and 2 drinks a day for men. One drink equals 12 oz of beer, 5 oz of wine, or 1 oz of hard liquor.  Work with your health care provider to maintain a healthy body weight or to lose weight. Ask what an ideal weight is for you.  Get at least 30 minutes of exercise that causes your heart to beat faster (aerobic exercise) most days of the week. Activities may include walking, swimming, or biking.  Work with your health care provider or diet and nutrition specialist (dietitian) to adjust your eating plan to your individual calorie needs. Reading food labels  Check food labels for the amount  of sodium per serving. Choose foods with less than 5 percent of the Daily Value of sodium. Generally, foods with less than 300 mg of sodium per serving fit into this eating plan.  To find whole grains, look for the word "whole" as the first word in the ingredient list. Shopping  Buy products labeled as "low-sodium" or "no salt added."  Buy fresh foods. Avoid canned foods and premade or frozen meals. Cooking  Avoid adding salt when cooking. Use salt-free seasonings or herbs instead of table salt  or sea salt. Check with your health care provider or pharmacist before using salt substitutes.  Do not fry foods. Cook foods using healthy methods such as baking, boiling, grilling, and broiling instead.  Cook with heart-healthy oils, such as olive, canola, soybean, or sunflower oil. Meal planning   Eat a balanced diet that includes: ? 5 or more servings of fruits and vegetables each day. At each meal, try to fill half of your plate with fruits and vegetables. ? Up to 6-8 servings of whole grains each day. ? Less than 6 oz of lean meat, poultry, or fish each day. A 3-oz serving of meat is about the same size as a deck of cards. One egg equals 1 oz. ? 2 servings of low-fat dairy each day. ? A serving of nuts, seeds, or beans 5 times each week. ? Heart-healthy fats. Healthy fats called Omega-3 fatty acids are found in foods such as flaxseeds and coldwater fish, like sardines, salmon, and mackerel.  Limit how much you eat of the following: ? Canned or prepackaged foods. ? Food that is high in trans fat, such as fried foods. ? Food that is high in saturated fat, such as fatty meat. ? Sweets, desserts, sugary drinks, and other foods with added sugar. ? Full-fat dairy products.  Do not salt foods before eating.  Try to eat at least 2 vegetarian meals each week.  Eat more home-cooked food and less restaurant, buffet, and fast food.  When eating at a restaurant, ask that your food be prepared  with less salt or no salt, if possible. What foods are recommended? The items listed may not be a complete list. Talk with your dietitian about what dietary choices are best for you. Grains Whole-grain or whole-wheat bread. Whole-grain or whole-wheat pasta. Brown rice. Orpah Cobb. Bulgur. Whole-grain and low-sodium cereals. Pita bread. Low-fat, low-sodium crackers. Whole-wheat flour tortillas. Vegetables Fresh or frozen vegetables (raw, steamed, roasted, or grilled). Low-sodium or reduced-sodium tomato and vegetable juice. Low-sodium or reduced-sodium tomato sauce and tomato paste. Low-sodium or reduced-sodium canned vegetables. Fruits All fresh, dried, or frozen fruit. Canned fruit in natural juice (without added sugar). Meat and other protein foods Skinless chicken or [place]. Ground chicken or [place]. Pork with fat trimmed off. Fish and seafood. Egg whites. Dried beans, peas, or lentils. Unsalted nuts, nut butters, and seeds. Unsalted canned beans. Lean cuts of beef with fat trimmed off. Low-sodium, lean deli meat. Dairy Low-fat (1%) or fat-free (skim) milk. Fat-free, low-fat, or reduced-fat cheeses. Nonfat, low-sodium ricotta or cottage cheese. Low-fat or nonfat yogurt. Low-fat, low-sodium cheese. Fats and oils Soft margarine without trans fats. Vegetable oil. Low-fat, reduced-fat, or light mayonnaise and salad dressings (reduced-sodium). Canola, safflower, olive, soybean, and sunflower oils. Avocado. Seasoning and other foods Herbs. Spices. Seasoning mixes without salt. Unsalted popcorn and pretzels. Fat-free sweets. What foods are not recommended? The items listed may not be a complete list. Talk with your dietitian about what dietary choices are best for you. Grains Baked goods made with fat, such as croissants, muffins, or some breads. Dry pasta or rice meal packs. Vegetables Creamed or fried vegetables. Vegetables in a cheese sauce. Regular canned vegetables (not low-sodium or  reduced-sodium). Regular canned tomato sauce and paste (not low-sodium or reduced-sodium). Regular tomato and vegetable juice (not low-sodium or reduced-sodium). Rosita Fire. Olives. Fruits Canned fruit in a light or heavy syrup. Fried fruit. Fruit in cream or butter sauce. Meat and other protein foods Fatty cuts of meat. Ribs. Fried meat. Tomasa Blase. Sausage.  Bologna and other processed lunch meats. Salami. Fatback. Hotdogs. Bratwurst. Salted nuts and seeds. Canned beans with added salt. Canned or smoked fish. Whole eggs or egg yolks. Chicken or [place] with skin. Dairy Whole or 2% milk, cream, and half-and-half. Whole or full-fat cream cheese. Whole-fat or sweetened yogurt. Full-fat cheese. Nondairy creamers. Whipped toppings. Processed cheese and cheese spreads. Fats and oils Butter. Stick margarine. Lard. Shortening. Ghee. Bacon fat. Tropical oils, such as coconut, palm kernel, or palm oil. Seasoning and other foods Salted popcorn and pretzels. Onion salt, garlic salt, seasoned salt, table salt, and sea salt. Worcestershire sauce. Tartar sauce. Barbecue sauce. Teriyaki sauce. Soy sauce, including reduced-sodium. Steak sauce. Canned and packaged gravies. Fish sauce. Oyster sauce. Cocktail sauce. Horseradish that you find on the shelf. Ketchup. Mustard. Meat flavorings and tenderizers. Bouillon cubes. Hot sauce and Tabasco sauce. Premade or packaged marinades. Premade or packaged taco seasonings. Relishes. Regular salad dressings. Where to find more information:  National Heart, Lung, and Blood Institute: PopSteam.is  American Heart Association: www.heart.org Summary  The DASH eating plan is a healthy eating plan that has been shown to reduce high blood pressure (hypertension). It may also reduce your risk for type 2 diabetes, heart disease, and stroke.  With the DASH eating plan, you should limit salt (sodium) intake to 2,300 mg a day. If you have hypertension, you may need to reduce your sodium  intake to 1,500 mg a day.  When on the DASH eating plan, aim to eat more fresh fruits and vegetables, whole grains, lean proteins, low-fat dairy, and heart-healthy fats.  Work with your health care provider or diet and nutrition specialist (dietitian) to adjust your eating plan to your individual calorie needs. This information is not intended to replace advice given to you by your health care provider. Make sure you discuss any questions you have with your health care provider. Document Released: 10/11/2011 Document Revised: 10/15/2016 Document Reviewed: 10/15/2016 Elsevier Interactive Patient Education  [employer].

## 2018-01-11 ENCOUNTER — Encounter: Payer: Self-pay | Admitting: Family Medicine

## 2018-01-11 DIAGNOSIS — Z720 Tobacco use: Secondary | ICD-10-CM | POA: Insufficient documentation

## 2018-01-11 DIAGNOSIS — R937 Abnormal findings on diagnostic imaging of other parts of musculoskeletal system: Secondary | ICD-10-CM | POA: Insufficient documentation

## 2018-01-11 NOTE — Assessment & Plan Note (Signed)
With normal DEXA; patient does recall remote trauma

## 2018-01-11 NOTE — Assessment & Plan Note (Signed)
More than 3 minutes spent counseling on smoking cessation; harms of smoking, strategies to quit, medication options available; he opted for wellbutrin; see AVS; close f/u

## 2018-01-11 NOTE — Assessment & Plan Note (Signed)
Discussed BP today; DASH guidelines, smoking cessation; close f/u

## 2018-02-07 ENCOUNTER — Encounter: Payer: Self-pay | Admitting: Family Medicine

## 2018-02-07 ENCOUNTER — Ambulatory Visit (INDEPENDENT_AMBULATORY_CARE_PROVIDER_SITE_OTHER): Payer: 59 | Admitting: Family Medicine

## 2018-02-07 VITALS — BP 122/68 | HR 94 | Temp 98.7°F | Resp 14 | Ht 69.0 in | Wt 192.1 lb

## 2018-02-07 DIAGNOSIS — S6992XA Unspecified injury of left wrist, hand and finger(s), initial encounter: Secondary | ICD-10-CM | POA: Diagnosis not present

## 2018-02-07 DIAGNOSIS — T50905A Adverse effect of unspecified drugs, medicaments and biological substances, initial encounter: Secondary | ICD-10-CM

## 2018-02-07 DIAGNOSIS — Z720 Tobacco use: Secondary | ICD-10-CM | POA: Diagnosis not present

## 2018-02-07 NOTE — Progress Notes (Signed)
BP 122/68   Pulse 94   Temp 98.7 F (37.1 C) (Oral)   Resp 14   Ht 5\' 9"  (1.753 m)   Wt 192 lb 1.6 oz (87.1 kg)   SpO2 98%   BMI 28.37 kg/m    Subjective:    Patient ID: Timothy Padilla, male    DOB: 07/23/1991, 27 y.o.   MRN: [id number]  HPI: Timothy Padilla is a 27 y.o. male  Chief Complaint  Patient presents with  . Follow-up    stopped wellbutrin due to side effects: tremors, mood swing,fatigue  . Hand Pain    left thumb, it was bent back    HPI Patient is here for follow-up He had to stop the wellbutrin He had symptoms within just a few days; wandered around with tools in hand wondering where they were; he was out of it; tremors, mood swings, fatigue Smoking about 1 to 1.5 ppd; he has not called the QUIT LINE He wants to quit because of the health issues Father and mother smoke; lots of friends smoke Earliest cig of the day can be an hour after waking up; not waking up in the middle of the night to smoke; won't smoke in the house; spends time on the road with work; other guy does not smoke He has been gaining weight anyway; we talked about possibility of weight gain when stopping; not drinking enough water  He hurt this left thumb; he was helping someone with a car and the steering was broken The left thumb snapped back and pushed the left thumb all the way; that was March 10th Hardly any strength in the thumb Swollen up pretty bad Still doesn't feel right Broke the scaphoid before and it was missed on xrays 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 Hypertension    Past Surgical History:  Procedure Laterality Date  . SCAPHOID FRACTURE SURGERY  Left 2012   Family History  Problem Relation Age of Onset  . Hypertension Father   . Heart attack Maternal Grandmother   . Hypertension Paternal Grandmother   . Stroke Paternal Grandmother   . COPD Paternal Grandfather    Social History   Tobacco Use  . Smoking status: Current Every Day Smoker    Packs/day: 1.50    Years: 14.00    Pack years: 21.00    Types: Cigarettes    Start date: 01/07/2004  . Smokeless tobacco: Former NeurosurgeonUser    Types: Snuff  . Tobacco comment: just tried smokeless 2 times, does not currently use  Substance Use Topics  . Alcohol use: No    Alcohol/week: 0.0 oz    Frequency: Never  . Drug use: No    Interim medical history since last visit reviewed. Allergies and medications reviewed  Review of Systems Per HPI unless specifically indicated above     Objective:    BP 122/68   Pulse 94   Temp 98.7 F (37.1 C) (Oral)   Resp 14   Ht 5\' 9"  (1.753 m)   Wt 192 lb 1.6 oz (87.1 kg)   SpO2 98%   BMI 28.37 kg/m   Wt  Readings from Last 3 Encounters:  02/07/18 192 lb 1.6 oz (87.1 kg)  01/06/18 188 lb 3.2 oz (85.4 kg)  04/30/17 160 lb (72.6 kg)    Physical Exam  Constitutional: He appears well-developed and well-nourished. No distress.  Cardiovascular: Normal rate and regular rhythm.  Pulmonary/Chest: Effort normal and breath sounds normal.  Musculoskeletal:       Right hand: He exhibits normal range of motion, no tenderness and no deformity. Normal strength noted.       Left hand: He exhibits decreased range of motion and tenderness. He exhibits no bony tenderness, no deformity and no swelling. Decreased strength noted. He exhibits finger abduction and thumb/finger opposition.  Psychiatric: He has a normal mood and affect.      Assessment & Plan:   Problem List Items Addressed This Visit      Other   Tobacco abuse (Chronic)    Patient has good insight, realizing that he is not ready to quit at this time; we talked about strategies to try to at  least decrease the number of cigarettes he smokes per day, in the car, etc.; I am here to help if and when needed       Other Visit Diagnoses    Thumb injury, left, initial encounter    -  Primary   refer to hand specialist; suspect tendon injury or other pathology since he continues to have pain nearly four weeks later; he agrees   Relevant Orders   Ambulatory referral to Orthopedic Surgery   Adverse effect of drug, initial encounter       reaction to wellbutrin; note to staff to please add that medicine to adverse reactions in system       Follow up plan: Return if symptoms worsen or fail to improve.  An after-visit summary was printed and given to the patient at check-out.  Please see the patient instructions which may contain other information and recommendations beyond what is mentioned above in the assessment and plan.  No orders of the defined types were placed in this encounter.   Orders Placed This Encounter  Procedures  . Ambulatory referral to Orthopedic Surgery

## 2018-02-07 NOTE — Assessment & Plan Note (Signed)
Patient has good insight, realizing that he is not ready to quit at this time; we talked about strategies to try to at least decrease the number of cigarettes he smokes per day, in the car, etc.; I am here to help if and when needed

## 2018-02-07 NOTE — Patient Instructions (Addendum)
Check out the information at familydoctor.org entitled "Nutrition for Weight Loss: What You Need to Know about Fad Diets" Try to lose between 1-2 pounds per week by taking in fewer calories and burning off more calories You can succeed by limiting portions, limiting foods dense in calories and fat, becoming more active, and drinking 8 glasses of water a day (64 ounces) Don't skip meals, especially breakfast, as skipping meals may alter your metabolism Do not use over-the-counter weight loss pills or gimmicks that claim rapid weight loss A healthy BMI (or body mass index) is between 18.5 and 24.9 You can calculate your ideal BMI at the NIH website JobEconomics.huhttp://www.nhlbi.nih.gov/health/educational/lose_wt/BMI/bmicalc.htm Try some strategies to cut down on your cigarettes Someone should contact you soon about the hand doctor If you have not heard anything from my staff in a week about any orders/referrals/studies from today, please contact us here to follow-up [phone]

## 2018-12-04 ENCOUNTER — Ambulatory Visit: Payer: 59 | Admitting: Family Medicine

## 2018-12-04 ENCOUNTER — Encounter: Payer: Self-pay | Admitting: Family Medicine

## 2018-12-04 VITALS — BP 120/56 | HR 94 | Temp 98.3°F | Resp 12 | Ht 69.0 in | Wt 188.1 lb

## 2018-12-04 DIAGNOSIS — J029 Acute pharyngitis, unspecified: Secondary | ICD-10-CM | POA: Diagnosis not present

## 2018-12-04 LAB — POCT RAPID STREP A (OFFICE): Rapid Strep A Screen: NEGATIVE

## 2018-12-04 MED ORDER — AMOXICILLIN-POT CLAVULANATE 875-125 MG PO TABS: 1.0000 | ORAL_TABLET | Freq: Two times a day (BID) | ORAL | 0 refills | Status: DC

## 2018-12-04 NOTE — Progress Notes (Signed)
BP (!) 120/56   Pulse 94   Temp 98.3 F (36.8 C) (Oral)   Resp 12   Ht 5\' 9"  (1.753 m)   Wt 188 lb 1.6 oz (85.3 kg)   SpO2 98%   BMI 27.78 kg/m    Subjective:    Patient ID: Timothy Padilla, male    DOB: 10/05/1991, 28 y.o.   MRN: [id number]  HPI: Timothy Padilla is a 28 y.o. male  Chief Complaint  Patient presents with  . Sore Throat    onset 4 days with no other symptoms    HPI  He has a sore throat, x 3-4 days No known contacts with strep throat It's only the left tonsil; looked at it at the back Seeing white spots on the left tonsil Thinks it is pus Looks like it has a split in it No bad breath No swollen glands No fevers No rash on the chest Tried gargling with salt water, seemed to ease it for a few hours, then flared right back No travel No cough No real sinus drainage; sometimes has a little sinus drainage, when cold, heats with wood No ear symptoms  [facility] 12/04/2018 02/07/2018 01/06/2018 03/14/2017 07/11/2016  Decreased Interest 0 0 0 0 0  Down, Depressed, Hopeless 0 0 0 0 0  PHQ - 2 Score 0 0 0 0 0  Altered sleeping 0 - - - -  Tired, decreased energy 0 - - - -  Change in appetite 0 - - - -  Feeling bad or failure about yourself  0 - - - -  Trouble concentrating 0 - - - -  Moving slowly or fidgety/restless 0 - - - -  Suicidal thoughts 0 - - - -  PHQ-9 Score 0 - - - -  Difficult doing work/chores Not difficult at all - - - -   Fall Risk  12/04/2018 02/07/2018 01/06/2018 03/14/2017 07/11/2016  Falls in the past year? 0 No No No No  Number falls in past yr: 0 - - - -  Injury with Fall? 0 - - - -    Relevant past medical, surgical, family and social history reviewed Past Medical History:  Diagnosis Date  . Hypertension    Past Surgical History:  Procedure Laterality Date  . SCAPHOID FRACTURE SURGERY Left 2012   Family History  Problem Relation Age of Onset  . Hypertension Father   . Heart attack Maternal Grandmother   . Hypertension  Paternal Grandmother   . Stroke Paternal Grandmother   . COPD Paternal Grandfather    Social History   Tobacco Use  . Smoking status: Current Every Day Smoker    Packs/day: 1.50    Years: 14.00    Pack years: 21.00    Types: Cigarettes    Start date: 01/07/2004  . Smokeless tobacco: Former NeurosurgeonUser    Types: Snuff  . Tobacco comment: just tried smokeless 2 times, does not currently use  Substance Use Topics  . Alcohol use: Yes    Alcohol/week: 2.0 - 3.0 standard drinks    Types: 2 - 3 Cans of beer per week    Frequency: Never    Comment: per day  . Drug use: No     [facility]  AUDIT-C Score  5      Interim medical history since last visit reviewed. Allergies and medications reviewed  Review of Systems Per HPI unless specifically indicated above  Objective:    BP (!) 120/56   Pulse 94   Temp 98.3 F (36.8 C) (Oral)   Resp 12   Ht 5\' 9"  (1.753 m)   Wt 188 lb 1.6 oz (85.3 kg)   SpO2 98%   BMI 27.78 kg/m   Wt Readings from Last 3 Encounters:  12/04/18 188 lb 1.6 oz (85.3 kg)  02/07/18 192 lb 1.6 oz (87.1 kg)  01/06/18 188 lb 3.2 oz (85.4 kg)    Physical Exam Vitals signs reviewed.  Constitutional:      General: He is not in acute distress.    Appearance: He is not toxic-appearing.  HENT:     Right Ear: Tympanic membrane and ear canal normal.     Left Ear: Tympanic membrane and ear canal normal.     Nose: Nose normal. No congestion or rhinorrhea.     Mouth/Throat:     Mouth: Mucous membranes are moist. No oral lesions.     Pharynx: Posterior oropharyngeal erythema present. No oropharyngeal exudate.     Tonsils: No tonsillar exudate or tonsillar abscesses. Swelling: 1+ on the right. 1+ on the left.     Comments: Left only slightly larger than right, but not 2+ Eyes:     General: No scleral icterus. Cardiovascular:     Rate and Rhythm: Normal rate and regular rhythm.  Pulmonary:     Effort: Pulmonary  effort is normal.     Breath sounds: Normal breath sounds.  Lymphadenopathy:     Cervical: No cervical adenopathy.  Skin:    Coloration: Skin is not pale.  Neurological:     Mental Status: He is alert.     Results for orders placed or performed in visit on 12/04/18  POCT rapid strep A  Result Value Ref Range   Rapid Strep A Screen Negative Negative      Assessment & Plan:   Problem List Items Addressed This Visit    None    Visit Diagnoses    Sore throat    -  Primary   start antibiotics; culture pending, gargle with salt water; OTC NSAID or tylenol if needed for pain; infectious so don't share drinks, utensils   Relevant Orders   POCT rapid strep A (Completed)   Culture, Group A Strep       Follow up plan: No follow-ups on file.  An after-visit summary was printed and given to the patient at check-out.  Please see the patient instructions which may contain other information and recommendations beyond what is mentioned above in the assessment and plan.  Meds ordered this encounter  Medications  . amoxicillin-clavulanate (AUGMENTIN) 875-125 MG tablet    Sig: Take 1 tablet by mouth 2 (two) times daily.    Dispense:  20 tablet    Refill:  0    Orders Placed This Encounter  Procedures  . Culture, Group A Strep  . POCT rapid strep A

## 2018-12-04 NOTE — Patient Instructions (Addendum)
Start the antibiotics  Pharyngitis  Pharyngitis is a sore throat (pharynx). This is when there is redness, pain, and swelling in your throat. Most of the time, this condition gets better on its own. In some cases, you may need medicine. Follow these instructions at home:  Take over-the-counter and prescription medicines only as told by your doctor. ? If you were prescribed an antibiotic medicine, take it as told by your doctor. Do not stop taking the antibiotic even if you start to feel better. ? Do not give children aspirin. Aspirin has been linked to Reye syndrome.  Drink enough water and fluids to keep your pee (urine) clear or pale yellow.  Get a lot of rest.  Rinse your mouth (gargle) with a salt-water mixture 3-4 times a day or as needed. To make a salt-water mixture, completely dissolve -1 tsp of salt in 1 cup of warm water.  If your doctor approves, you may use throat lozenges or sprays to soothe your throat. Contact a doctor if:  You have large, tender lumps in your neck.  You have a rash.  You cough up green, yellow-brown, or bloody spit. Get help right away if:  You have a stiff neck.  You drool or cannot swallow liquids.  You cannot drink or take medicines without throwing up.  You have very bad pain that does not go away with medicine.  You have problems breathing, and it is not from a stuffy nose.  You have new pain and swelling in your knees, ankles, wrists, or elbows. Summary  Pharyngitis is a sore throat (pharynx). This is when there is redness, pain, and swelling in your throat.  If you were prescribed an antibiotic medicine, take it as told by your doctor. Do not stop taking the antibiotic even if you start to feel better.  Most of the time, pharyngitis gets better on its own. Sometimes, you may need medicine. This information is not intended to replace advice given to you by your health care provider. Make sure you discuss any questions you have with  your health care provider. Document Released: 04/09/2008 Document Revised: 11/27/2016 Document Reviewed: 11/27/2016 Elsevier Interactive Patient Education  2019 [employer].

## 2018-12-06 LAB — CULTURE, GROUP A STREP
MICRO NUMBER:: 129982
SPECIMEN QUALITY: ADEQUATE

## 2019-04-07 ENCOUNTER — Emergency Department
Admission: EM | Admit: 2019-04-07 | Discharge: 2019-04-07 | Disposition: A | Discharge status: Home / Self Care | Payer: No Typology Code available for payment source | Attending: Emergency Medicine | Admitting: Emergency Medicine

## 2019-04-07 ENCOUNTER — Encounter: Payer: Self-pay | Admitting: Emergency Medicine

## 2019-04-07 ENCOUNTER — Emergency Department: Payer: No Typology Code available for payment source

## 2019-04-07 DIAGNOSIS — W230XXA Caught, crushed, jammed, or pinched between moving objects, initial encounter: Secondary | ICD-10-CM | POA: Diagnosis not present

## 2019-04-07 DIAGNOSIS — Y990 Civilian activity done for income or pay: Secondary | ICD-10-CM | POA: Insufficient documentation

## 2019-04-07 DIAGNOSIS — Y9389 Activity, other specified: Secondary | ICD-10-CM | POA: Insufficient documentation

## 2019-04-07 DIAGNOSIS — F17210 Nicotine dependence, cigarettes, uncomplicated: Secondary | ICD-10-CM | POA: Insufficient documentation

## 2019-04-07 DIAGNOSIS — Z23 Encounter for immunization: Secondary | ICD-10-CM | POA: Insufficient documentation

## 2019-04-07 DIAGNOSIS — S62632B Displaced fracture of distal phalanx of right middle finger, initial encounter for open fracture: Secondary | ICD-10-CM | POA: Insufficient documentation

## 2019-04-07 DIAGNOSIS — I10 Essential (primary) hypertension: Secondary | ICD-10-CM | POA: Insufficient documentation

## 2019-04-07 DIAGNOSIS — Y92818 Other transport vehicle as the place of occurrence of the external cause: Secondary | ICD-10-CM | POA: Insufficient documentation

## 2019-04-07 DIAGNOSIS — S61212A Laceration without foreign body of right middle finger without damage to nail, initial encounter: Secondary | ICD-10-CM | POA: Diagnosis present

## 2019-04-07 MED ORDER — LIDOCAINE HCL (PF) 1 % IJ SOLN
2.0000 mL | Freq: Once | INTRAMUSCULAR | Status: DC
Start: 2019-04-07 — End: 2019-04-08
  Filled 2019-04-07: qty 5

## 2019-04-07 MED ORDER — TETANUS-DIPHTH-ACELL PERTUSSIS 5-2.5-18.5 LF-MCG/0.5 IM SUSP
0.5000 mL | Freq: Once | INTRAMUSCULAR | Status: AC
  Administered 2019-04-07: 0.5 mL via INTRAMUSCULAR
  Filled 2019-04-07: qty 0.5

## 2019-04-07 MED ORDER — CLINDAMYCIN PHOSPHATE 600 MG/4ML IJ SOLN
600.0000 mg | Freq: Once | INTRAMUSCULAR | Status: AC
  Administered 2019-04-07: 600 mg via INTRAMUSCULAR
  Filled 2019-04-07: qty 4

## 2019-04-07 MED ORDER — HYDROCODONE-ACETAMINOPHEN 5-325 MG PO TABS: 1.0000 | ORAL_TABLET | Freq: Four times a day (QID) | ORAL | 0 refills | Status: DC | PRN

## 2019-04-07 MED ORDER — TETANUS-DIPHTHERIA TOXOIDS TD 5-2 LFU IM INJ
0.5000 mL | INJECTION | Freq: Once | INTRAMUSCULAR | Status: DC
  Filled 2019-04-07: qty 0.5

## 2019-04-07 MED ORDER — CLINDAMYCIN HCL 300 MG PO CAPS: 300.0000 mg | ORAL_CAPSULE | Freq: Four times a day (QID) | ORAL | 0 refills | Status: AC

## 2019-04-07 NOTE — Discharge Instructions (Addendum)
Please wear splint to protect finger.  Keep laceration site clean and dry.  Take antibiotics as prescribed.  Take pain medication as needed for severe pain.  Follow-up with orthopedics in 8 to 10 days for suture removal

## 2019-04-07 NOTE — ED Provider Notes (Signed)
[facility] EMERGENCY DEPARTMENT Provider Note   CSN: [id number] Arrival date & time: 04/07/19  2120    History   Chief Complaint Chief Complaint  Patient presents with  . Laceration    HPI Timothy Padilla is a 28 y.o. male.  Presents to the emergency department for evaluation of right hand laceration.  Patient suffered a laceration to the distal phalanx of the right third digit.  Laceration occurred just prior to arrival.  Patient states he was shutting the door on the vehicle he accidentally closed the door on his right third digit.  Patient suffered a laceration to the distal portion of the right third digit.  He said some numbness.  Denies any other injury to his body.  His tetanus is not up-to-date.  Pain is mild.     HPI  Past Medical History:  Diagnosis Date  . Hypertension     Patient Active Problem List   Diagnosis Date Noted  . Tobacco abuse 01/11/2018  . Abnormal x-ray of thoracic spine 01/11/2018  . Annual physical exam 07/11/2016  . Elevated blood pressure reading 03/27/2016  . Stress at work 03/27/2016    Past Surgical History:  Procedure Laterality Date  . SCAPHOID FRACTURE SURGERY Left 2012        Home Medications    Prior to Admission medications   Medication Sig Start Date End Date Taking? Authorizing Provider  amoxicillin-clavulanate (AUGMENTIN) 875-125 MG tablet Take 1 tablet by mouth 2 (two) times daily. 12/04/18   Kerman PasseyLada, Melinda P, MD  clindamycin (CLEOCIN) 300 MG capsule Take 1 capsule (300 mg total) by mouth 4 (four) times daily for 7 days. 04/07/19 04/14/19  Evon SlackGaines, Thomas C, PA-C  HYDROcodone-acetaminophen (NORCO) 5-325 MG tablet Take 1 tablet by mouth every 6 (six) hours as needed for moderate pain. 04/07/19   Evon SlackGaines, Thomas C, PA-C    Family History Family History  Problem Relation Age of Onset  . Hypertension Father   . Heart attack Maternal Grandmother   . Hypertension Paternal Grandmother   . Stroke Paternal  Grandmother   . COPD Paternal Grandfather     Social History Social History   Tobacco Use  . Smoking status: Current Every Day Smoker    Packs/day: 1.50    Years: 14.00    Pack years: 21.00    Types: Cigarettes    Start date: 01/07/2004  . Smokeless tobacco: Former NeurosurgeonUser    Types: Snuff  . Tobacco comment: just tried smokeless 2 times, does not currently use  Substance Use Topics  . Alcohol use: Yes    Alcohol/week: 2.0 - 3.0 standard drinks    Types: 2 - 3 Cans of beer per week    Frequency: Never    Comment: per day  . Drug use: No     Allergies   Wellbutrin [bupropion]   Review of Systems Review of Systems  Constitutional: Negative for chills and fever.  Musculoskeletal: Positive for arthralgias.  Skin: Positive for wound. Negative for rash.     Physical Exam Updated Vital Signs BP (!) 156/89 (BP Location: Right Arm)   Pulse (!) 101   Temp 98.4 F (36.9 C) (Oral)   Resp 20   SpO2 98%   Physical Exam Constitutional:      Appearance: He is well-developed.  HENT:     Head: Normocephalic and atraumatic.  Eyes:     Conjunctiva/sclera: Conjunctivae normal.  Neck:     Musculoskeletal: Normal range of motion.  Cardiovascular:  Rate and Rhythm: Normal rate.  Pulmonary:     Effort: Pulmonary effort is normal. No respiratory distress.  Musculoskeletal: Normal range of motion.     Comments: Normal active range of motion of the right third digit with flexion extension.  He has avulsion of the soft tissue of the volar aspect of the distal phalanx with laceration just under the nail.  Skin:    General: Skin is warm.     Findings: No rash.  Neurological:     Mental Status: He is alert and oriented to person, place, and time.  Psychiatric:        Behavior: Behavior normal.        Thought Content: Thought content normal.      ED Treatments / Results  Labs (all labs ordered are listed, but only abnormal results are displayed) Labs Reviewed - No data to  display  EKG None  Radiology Dg Finger Middle Right  Result Date: 04/07/2019 CLINICAL DATA:  Laceration EXAM: RIGHT MIDDLE FINGER 2+V COMPARISON:  None. FINDINGS: There is an acute mildly displaced fracture involving the tuft of the distal phalanx. The fracture fragment is displaced ventrally. There is an acute nondisplaced fracture of the proximal aspect of the distal phalanx of the third digit. There appears to be associated soft tissue laceration with surrounding soft tissue swelling. IMPRESSION: Acute fractures of the distal phalanx of the third digit. Electronically Signed   By: Katherine Mantle M.D.   On: 04/07/2019 21:47    Procedures .Marland KitchenLaceration Repair Date/Time: 04/07/2019 10:25 PM Performed by: Evon Slack, PA-C Authorized by: Evon Slack, PA-C   Consent:    Consent obtained:  Verbal   Consent given by:  Patient   Risks discussed:  Infection and need for additional repair Anesthesia (see MAR for exact dosages):    Anesthesia method:  Nerve block   Block location:  Right third digit   Block needle gauge:  27 G   Block anesthetic:  Lidocaine 1% w/o epi   Block injection procedure:  Anatomic landmarks identified, introduced needle and negative aspiration for blood   Block outcome:  Anesthesia achieved Laceration details:    Location:  Finger   Finger location:  R long finger   Length (cm):  2   Depth (mm):  3 Repair type:    Repair type:  Complex Pre-procedure details:    Preparation:  Patient was prepped and draped in usual sterile fashion Exploration:    Wound exploration: wound explored through full range of motion     Wound extent: underlying fracture     Wound extent: no foreign bodies/material noted     Contaminated: no   Treatment:    Area cleansed with:  Betadine and saline   Amount of cleaning:  Extensive   Irrigation method:  Pressure wash Skin repair:    Repair method:  Sutures   Suture size:  5-0   Suture material:  Fast-absorbing gut    Suture technique:  Simple interrupted   Number of sutures:  8 (2 absorbable sutures placed under the nail.  2 absorbable sutures placed into the nail to hold the nail and position in the form of 0 but sutures placed on the medial and lateral aspect of the digit) Approximation:    Approximation:  Close Post-procedure details:    Dressing:  Antibiotic ointment and non-adherent dressing Comments:     Patient tolerated procedure well.   (including critical care time)  Medications Ordered in ED Medications  lidocaine (PF) (XYLOCAINE) 1 % injection 2 mL (has no administration in time range)  clindamycin (CLEOCIN) injection 600 mg (600 mg Intramuscular Given 04/07/19 2201)  Tdap (BOOSTRIX) injection 0.5 mL (0.5 mLs Intramuscular Given 04/07/19 2202)     Initial Impression / Assessment and Plan / ED Course  I have reviewed the triage vital signs and the nursing notes.  Pertinent labs & imaging results that were available during my care of the patient were reviewed by me and considered in my medical decision making (see chart for details).        28 year old male with Worker's Comp. injury to the right third digit.  He suffered a open fracture.  Patient was given IM clindamycin and tetanus shot here in the ED.  Patient will take p.o. clindamycin for 7 days.  He is educated on wound care.  He will call orthopedics to schedule follow-up appointment for wound check and repeat x-rays.  Final Clinical Impressions(s) / ED Diagnoses   Final diagnoses:  Open displaced fracture of distal phalanx of right middle finger, initial encounter    ED Discharge Orders         Ordered    clindamycin (CLEOCIN) 300 MG capsule  4 times daily     04/07/19 2147    HYDROcodone-acetaminophen (NORCO) 5-325 MG tablet  Every 6 hours PRN     04/07/19 2147           Ronnette Juniper 04/07/19 2228    Jeanmarie Plant, MD 04/07/19 2243

## 2019-04-07 NOTE — ED Triage Notes (Signed)
Pt at work when he shut door onto the right hand, leaving laceration to the tip of right middle finger. Bleeding controlled, nail intact.

## 2019-04-07 NOTE — ED Notes (Signed)
Pt did not bring worker's comp information with him and his employer [employer]) is not listed in our profile list therefore we cannot perform any tasks associated with worker's comp.

## 2019-09-22 ENCOUNTER — Ambulatory Visit
Admission: RE | Admit: 2019-09-22 | Discharge: 2019-09-22 | Disposition: A | Discharge status: Home / Self Care | Payer: 59 | Source: Ambulatory Visit | Attending: Family Medicine | Admitting: Family Medicine

## 2019-09-22 ENCOUNTER — Other Ambulatory Visit: Payer: Self-pay

## 2019-09-22 ENCOUNTER — Ambulatory Visit (INDEPENDENT_AMBULATORY_CARE_PROVIDER_SITE_OTHER): Payer: 59 | Admitting: Family Medicine

## 2019-09-22 ENCOUNTER — Encounter: Payer: Self-pay | Admitting: Family Medicine

## 2019-09-22 ENCOUNTER — Ambulatory Visit: Payer: Self-pay

## 2019-09-22 VITALS — BP 129/78 | HR 81 | Temp 97.7°F | Resp 14 | Ht 65.0 in | Wt 194.8 lb

## 2019-09-22 DIAGNOSIS — K219 Gastro-esophageal reflux disease without esophagitis: Secondary | ICD-10-CM

## 2019-09-22 DIAGNOSIS — Z720 Tobacco use: Secondary | ICD-10-CM | POA: Diagnosis not present

## 2019-09-22 DIAGNOSIS — R030 Elevated blood-pressure reading, without diagnosis of hypertension: Secondary | ICD-10-CM

## 2019-09-22 DIAGNOSIS — R079 Chest pain, unspecified: Secondary | ICD-10-CM

## 2019-09-22 DIAGNOSIS — R002 Palpitations: Secondary | ICD-10-CM

## 2019-09-22 MED ORDER — PANTOPRAZOLE SODIUM 40 MG PO TBEC: 40.0000 mg | DELAYED_RELEASE_TABLET | Freq: Every day | ORAL | 3 refills | Status: DC

## 2019-09-22 NOTE — Progress Notes (Signed)
Patient ID: Timothy Padilla, male    DOB: [birth year], 28 y.o.   MRN: [id number]  PCP: Danelle Berry, PA-C  Chief Complaint  Patient presents with  . Chest Pain    on and off getting worse over the last month    Subjective:   Timothy Padilla is a 28 y.o. male, presents to clinic with CC of the following:  On and off chest pain, central to left sided CP, onset randomly, feels like its "tense" tight, not always, but sometimes with radiation to his back. Getting worse over the past month.  Chest Pain  This is a new problem. The current episode started more than 1 month ago (months). The onset quality is sudden. The problem occurs intermittently. The problem has been gradually worsening. The pain is present in the substernal region. The pain is at a severity of 5/10. Quality: tense, tight, sometimes sharp and quick, sometimes its lasted longer 30 min with more of an ache. Associated symptoms include diaphoresis, dizziness, exertional chest pressure and palpitations. Pertinent negatives include no abdominal pain, back pain, claudication, cough, fever, headaches, hemoptysis, irregular heartbeat, leg pain, lower extremity edema, malaise/fatigue, nausea, near-syncope, numbness, orthopnea, PND, shortness of breath, sputum production, syncope or weakness. Associated symptoms comments: Tremor with it, once or twice vision disturbances and lightheadedness.  Gastroesophageal Reflux He complains of belching, chest pain and heartburn. He reports no abdominal pain, no choking, no coughing, no dysphagia, no early satiety, no hoarse voice, no nausea or no wheezing. burning epigastric and indigestion. The problem has been gradually worsening. The heartburn is of severe intensity. The heartburn does not wake him from sleep. The symptoms are aggravated by certain foods and lying down (stopped ETOH). Associated symptoms include fatigue. Pertinent negatives include no anemia or melena. Risk factors include smoking/tobacco  exposure, NSAIDs and caffeine use. He has tried an antacid (ginger) for the symptoms. The treatment provided mild relief.    Smoking cigarettes 1 -1.5 ppd  Family with hx of HTN, no cardiac hx in immediate family members    Patient Active Problem List   Diagnosis Date Noted  . Tobacco abuse 01/11/2018  . Abnormal x-ray of thoracic spine 01/11/2018  . Elevated blood pressure reading 03/27/2016      Current Outpatient Medications:  .  amoxicillin-clavulanate (AUGMENTIN) 875-125 MG tablet, Take 1 tablet by mouth 2 (two) times daily. (Patient not taking: Reported on 09/22/2019), Disp: 20 tablet, Rfl: 0 .  HYDROcodone-acetaminophen (NORCO) 5-325 MG tablet, Take 1 tablet by mouth every 6 (six) hours as needed for moderate pain. (Patient not taking: Reported on 09/22/2019), Disp: 15 tablet, Rfl: 0   Allergies  Allergen Reactions  . Wellbutrin [Bupropion]     Tremor,fatigue,mood changes     Family History  Problem Relation Age of Onset  . Hypertension Father   . Heart attack Maternal Grandmother   . Hypertension Paternal Grandmother   . Stroke Paternal Grandmother   . COPD Paternal Grandfather      Social History   Socioeconomic History  . Marital status: Single    Spouse name: Not on file  . Number of children: Not on file  . Years of education: Not on file  . Highest education level: Not on file  Occupational History  . Not on file  Social Needs  . Financial resource strain: Not on file  . Food insecurity    Worry: Not on file    Inability: Not on file  . Transportation needs    Medical:  Not on file    Non-medical: Not on file  Tobacco Use  . Smoking status: Current Every Day Smoker    Packs/day: 1.50    Years: 14.00    Pack years: 21.00    Types: Cigarettes    Start date: 01/07/2004  . Smokeless tobacco: Former Neurosurgeon    Types: Snuff  . Tobacco comment: just tried smokeless 2 times, does not currently use  Substance and Sexual Activity  . Alcohol use: Yes     Alcohol/week: 2.0 - 3.0 standard drinks    Types: 2 - 3 Cans of beer per week    Frequency: Never    Comment: per day  . Drug use: No  . Sexual activity: Not Currently    Partners: Female    Birth control/protection: Condom  Lifestyle  . Physical activity    Days per week: Not on file    Minutes per session: Not on file  . Stress: Not on file  Relationships  . Social Musician on phone: Not on file    Gets together: Not on file    Attends religious service: Not on file    Active member of club or organization: Not on file    Attends meetings of clubs or organizations: Not on file    Relationship status: Not on file  . Intimate partner violence    Fear of current or ex partner: Not on file    Emotionally abused: Not on file    Physically abused: Not on file    Forced sexual activity: Not on file  Other Topics Concern  . Not on file  Social History Narrative  . Not on file    I personally reviewed active problem list, medication list, allergies, family history, social history, health maintenance, notes from last encounter, lab results, imaging with the patient/caregiver today.  Review of Systems  Constitutional: Positive for diaphoresis and fatigue. Negative for fever and malaise/fatigue.  HENT: Negative.  Negative for hoarse voice.   Eyes: Negative.   Respiratory: Negative.  Negative for cough, hemoptysis, sputum production, choking, shortness of breath and wheezing.   Cardiovascular: Positive for chest pain and palpitations. Negative for orthopnea, claudication, syncope, PND and near-syncope.  Gastrointestinal: Positive for heartburn. Negative for abdominal pain, dysphagia, melena and nausea.  Endocrine: Negative.   Genitourinary: Negative.   Musculoskeletal: Negative.  Negative for back pain.  Skin: Negative.   Allergic/Immunologic: Negative.   Neurological: Positive for dizziness. Negative for weakness, numbness and headaches.  Hematological: Negative.    Psychiatric/Behavioral: Negative.   All other systems reviewed and are negative.      Objective:   Vitals:   09/22/19 1020  BP: (!) 142/82  Pulse: 81  Resp: 14  Temp: 97.7 F (36.5 C)  SpO2: 98%  Weight: 194 lb 12.8 oz (88.4 kg)  Height:  (1.651 m)    Body mass index is 32.42 kg/m.  Physical Exam Vitals signs and nursing note reviewed.  Constitutional:      General: He is not in acute distress.    Appearance: Normal appearance. He is well-developed. He is not ill-appearing, toxic-appearing or diaphoretic.     Interventions: Face mask in place.  HENT:     Head: Normocephalic and atraumatic.     Jaw: No trismus.     Right Ear: External ear normal.     Left Ear: External ear normal.  Eyes:     General: Lids are normal. No scleral icterus.  Conjunctiva/sclera: Conjunctivae normal.     Pupils: Pupils are equal, round, and reactive to light.  Neck:     Musculoskeletal: Normal range of motion and neck supple.     Trachea: Trachea and phonation normal. No tracheal deviation.  Cardiovascular:     Rate and Rhythm: Normal rate and regular rhythm.     Pulses: Normal pulses.          Radial pulses are 2+ on the right side and 2+ on the left side.       Posterior tibial pulses are 2+ on the right side and 2+ on the left side.     Heart sounds: Normal heart sounds. No murmur. No friction rub. No gallop.   Pulmonary:     Effort: Pulmonary effort is normal. No respiratory distress.     Breath sounds: Normal breath sounds. No stridor. No wheezing, rhonchi or rales.  Abdominal:     General: Bowel sounds are normal. There is no distension.     Palpations: Abdomen is soft.     Tenderness: There is no abdominal tenderness. There is no guarding or rebound.  Musculoskeletal: Normal range of motion.     Right lower leg: No edema.     Left lower leg: No edema.  Skin:    General: Skin is warm and dry.     Capillary Refill: Capillary refill takes less than 2 seconds.      Coloration: Skin is not jaundiced.     Findings: No rash.     Nails: There is no clubbing.   Neurological:     Mental Status: He is alert.     Cranial Nerves: No dysarthria or facial asymmetry.     Motor: No tremor or abnormal muscle tone.     Gait: Gait normal.  Psychiatric:        Mood and Affect: Mood normal.        Speech: Speech normal.        Behavior: Behavior normal. Behavior is cooperative.      ECG interpretation   Date: 09/22/19  Rate: 72  Rhythm: normal sinus rhythm  QRS Axis: normal  Intervals: normal  ST/T Wave abnormalities: normal  Conduction Disutrbances: none  Narrative Interpretation: NSR  Old EKG Reviewed:   No significant changes noted   Results for orders placed or performed in visit on 12/04/18  Culture, Group A Strep   Specimen: Throat  Result Value Ref Range   MICRO NUMBER: [id number]    SPECIMEN QUALITY: Adequate    SOURCE: THROAT    STATUS: FINAL    RESULT: No group A Streptococcus isolated   POCT rapid strep A  Result Value Ref Range   Rapid Strep A Screen Negative Negative        Assessment & Plan:     1. Chest pain, unspecified type Patient presents with intermittent sharp gradually worsening chest pain, does not seem to have any significant alleviating or aggravating factors, and comes and goes randomly, usually sharp and brief.  History not very concerning for ACS, EKG reassuring today, will get chest x-ray and basic labs.  Patient does appear somewhat anxious may be symptoms of anxiety, with smoking history also may be possible bronchospasm or may be acid reflux/GERD.  Do not feel we need to send her to the ER today for any cardiac enzymes and his heart rate vital signs are reassuring unlikely to be PE. - EKG 12-Lead - DG Chest 2 View - CBC w/ Diff -  CMP w GFR - TSH  2. Tobacco abuse Encouraged to stop or at least decrease smoking will help with his blood pressure  3. Elevated blood pressure reading Mildly elevated here  patient is anxious with pain, will check labs, have him monitor at home and do close follow-up to reevaluate before starting any medications.  Did discuss with him how something like propranolol may help with any palpitations, chest pain, anxiety symptoms and could minimally lower his blood pressure as well if you want to try it - CMP w GFR  4. Palpitations Rule out anemia or thyroid disease, may be anxiety symptoms, EKG had normal sinus rhythm - CBC w/ Diff - CMP w GFR - TSH  5. Gastroesophageal reflux disease, unspecified whether esophagitis present Suspect that some of the symptoms may be from acid reflux with possible gastritis we will treat with Protonix  Patient was asked to do 2-week trial of Protonix, GERD handout and lifestyle changes were printed for him and reviewed today, also atypical chest pain information possible causes and concerning signs and symptoms/red flags were also given to him today and reviewed at length, he verbalized understanding of ER precautions.    Return in about 2 weeks (around 10/06/2019) for f/up recheck GERD/CP.   Delsa Grana, PA-C 09/22/19 10:33 AM

## 2019-09-22 NOTE — Patient Instructions (Signed)
Nonspecific Chest Pain Chest pain can be caused by many different conditions. Some causes of chest pain can be life-threatening. These will require treatment right away. Serious causes of chest pain include:  Heart attack.  A tear in the body's main blood vessel.  Redness and swelling (inflammation) around your heart.  Blood clot in your lungs. Other causes of chest pain may not be so serious. These include:  Heartburn.  Anxiety or stress.  Damage to bones or muscles in your chest.  Lung infections. Chest pain can feel like:  Pain or discomfort in your chest.  Crushing, pressure, aching, or squeezing pain.  Burning or tingling.  Dull or sharp pain that is worse when you move, cough, or take a deep breath.  Pain or discomfort that is also felt in your back, neck, jaw, shoulder, or arm, or pain that spreads to any of these areas. It is hard to know whether your pain is caused by something that is serious or something that is not so serious. So it is important to see your doctor right away if you have chest pain. Follow these instructions at home: Medicines  Take over-the-counter and prescription medicines only as told by your doctor.  If you were prescribed an antibiotic medicine, take it as told by your doctor. Do not stop taking the antibiotic even if you start to feel better. Lifestyle   Rest as told by your doctor.  Do not use any products that contain nicotine or tobacco, such as cigarettes, e-cigarettes, and chewing tobacco. If you need help quitting, ask your doctor.  Do not drink alcohol.  Make lifestyle changes as told by your doctor. These may include: ? Getting regular exercise. Ask your doctor what activities are safe for you. ? Eating a heart-healthy diet. A diet and nutrition specialist (dietitian) can help you to learn healthy eating options. ? Staying at a healthy weight. ? Treating diabetes or high blood pressure, if needed. ? Lowering your stress.  Activities such as yoga and relaxation techniques can help. General instructions  Pay attention to any changes in your symptoms. Tell your doctor about them or any new symptoms.  Avoid any activities that cause chest pain.  Keep all follow-up visits as told by your doctor. This is important. You may need more testing if your chest pain does not go away. Contact a doctor if:  Your chest pain does not go away.  You feel depressed.  You have a fever. Get help right away if:  Your chest pain is worse.  You have a cough that gets worse, or you cough up blood.  You have very bad (severe) pain in your belly (abdomen).  You pass out (faint).  You have either of these for no clear reason: ? Sudden chest discomfort. ? Sudden discomfort in your arms, back, neck, or jaw.  You have shortness of breath at any time.  You suddenly start to sweat, or your skin gets clammy.  You feel sick to your stomach (nauseous).  You throw up (vomit).  You suddenly feel lightheaded or dizzy.  You feel very weak or tired.  Your heart starts to beat fast, or it feels like it is skipping beats. These symptoms may be an emergency. Do not wait to see if the symptoms will go away. Get medical help right away. Call your local emergency services (911 in the [place] Do not drive yourself to the hospital. Summary  Chest pain can be caused by many different conditions. The   cause may be serious and need treatment right away. If you have chest pain, see your doctor right away.  Follow your doctor's instructions for taking medicines and making lifestyle changes.  Keep all follow-up visits as told by your doctor. This includes visits for any further testing if your chest pain does not go away.  Be sure to know the signs that show that your condition has become worse. Get help right away if you have these symptoms. This information is not intended to replace advice given to you by your health care provider. Make  sure you discuss any questions you have with your health care provider. Document Released: 04/09/2008 Document Revised: 04/24/2018 Document Reviewed: 04/24/2018 Elsevier Patient Education  2020 [facility], [facility] When you have gastroesophageal reflux disease (GERD), the foods you eat and your eating habits are very important. Choosing the right foods can help ease your discomfort. Think about working with a nutrition specialist (dietitian) to help you make good choices. What are tips for following this plan?  Meals  Choose healthy foods that are low in fat, such as fruits, vegetables, whole grains, low-fat dairy products, and lean meat, fish, and poultry.  Eat small meals often instead of 3 large meals a day. Eat your meals slowly, and in a place where you are relaxed. Avoid bending over or lying down until 2-3 hours after eating.  Avoid eating meals 2-3 hours before bed.  Avoid drinking a lot of liquid with meals.  Cook foods using methods other than frying. Bake, grill, or broil food instead.  Avoid or limit: ? Chocolate. ? Peppermint or spearmint. ? Alcohol. ? Pepper. ? Black and decaffeinated coffee. ? Black and decaffeinated tea. ? Bubbly (carbonated) soft drinks. ? Caffeinated energy drinks and soft drinks.  Limit high-fat foods such as: ? Fatty meat or fried foods. ? Whole milk, cream, butter, or ice cream. ? Nuts and nut butters. ? Pastries, donuts, and sweets made with butter or shortening.  Avoid foods that cause symptoms. These foods may be different for everyone. Common foods that cause symptoms include: ? Tomatoes. ? Oranges, lemons, and limes. ? Peppers. ? Spicy food. ? Onions and garlic. ? Vinegar. Lifestyle  Maintain a healthy weight. Ask your doctor what weight is healthy for you. If you need to lose weight, work with your doctor to do so safely.  Exercise for at least 30 minutes for 5 or more days  each week, or as told by your doctor.  Wear loose-fitting clothes.  Do not smoke. If you need help quitting, ask your doctor.  Sleep with the head of your bed higher than your feet. Use a wedge under the mattress or blocks under the bed frame to raise the head of the bed. Summary  When you have gastroesophageal reflux disease (GERD), food and lifestyle choices are very important in easing your symptoms.  Eat small meals often instead of 3 large meals a day. Eat your meals slowly, and in a place where you are relaxed.  Limit high-fat foods such as fatty meat or fried foods.  Avoid bending over or lying down until 2-3 hours after eating.  Avoid peppermint and spearmint, caffeine, alcohol, and chocolate. This information is not intended to replace advice given to you by your health care provider. Make sure you discuss any questions you have with your health care provider. Document Released: 04/22/2012 Document Revised: 02/12/2019 Document Reviewed: 11/27/2016 Elsevier Patient Education  2020 Else[employer]  Inc[employer]  

## 2019-09-22 NOTE — Telephone Encounter (Signed)
Pt. Reports he has been having chest pain on and off x 1 month. No pain today. Lasts 5-30 minutes. Hurts in the middle of his chest and hurts into his back as well. Had sharp pain last night x 5 minutes and felt like his heart was fluttering.Concerned his BP may be up as well.Appointment made for today.  Reason for Disposition . [1] Chest pain lasts > 5 minutes AND [2] occurred > 3 days ago (72 hours) AND [3] NO chest pain or cardiac symptoms now  Answer Assessment - Initial Assessment Questions 1. LOCATION: "Where does it hurt?"       Hurts in the middle of chest and into his back 2. RADIATION: "Does the pain go anywhere else?" (e.g., into neck, jaw, arms, back)     Back 3. ONSET: "When did the chest pain begin?" (Minutes, hours or days)      Last night 4. PATTERN "Does the pain come and go, or has it been constant since it started?"  "Does it get worse with exertion?"      Comes and goes 5. DURATION: "How long does it last" (e.g., seconds, minutes, hours)     5 minutes 6. SEVERITY: "How bad is the pain?"  (e.g., Scale 1-10; mild, moderate, or severe)    - MILD (1-3): doesn't interfere with normal activities     - MODERATE (4-7): interferes with normal activities or awakens from sleep    - SEVERE (8-10): excruciating pain, unable to do any normal activities       5 7. CARDIAC RISK FACTORS: "Do you have any history of heart problems or risk factors for heart disease?" (e.g., angina, prior heart attack; diabetes, high blood pressure, high cholesterol, smoker, or strong family history of heart disease)     Has had High BP in the past 8. PULMONARY RISK FACTORS: "Do you have any history of lung disease?"  (e.g., blood clots in lung, asthma, emphysema, birth control pills)     No 9. CAUSE: "What do you think is causing the chest pain?"     Unsure 10. OTHER SYMPTOMS: "Do you have any other symptoms?" (e.g., dizziness, nausea, vomiting, sweating, fever, difficulty breathing, cough)       Maybe  short of breath 11. PREGNANCY: "Is there any chance you are pregnant?" "When was your last menstrual period?"       n/a  Protocols used: CHEST PAIN-A-AH

## 2019-09-23 LAB — COMPLETE METABOLIC PANEL WITH GFR
AG Ratio: 1.6 (calc) (ref 1.0–2.5)
ALT: 15 U/L (ref 9–46)
AST: 15 U/L (ref 10–40)
Albumin: 4.4 g/dL (ref 3.6–5.1)
Alkaline phosphatase (APISO): 65 U/L (ref 36–130)
BUN: 11 mg/dL (ref 7–25)
CO2: 29 mmol/L (ref 20–32)
Calcium: 9.5 mg/dL (ref 8.6–10.3)
Chloride: 107 mmol/L (ref 98–110)
Creat: 0.72 mg/dL (ref 0.60–1.35)
GFR, Est African American: 146 mL/min/{1.73_m2} (ref >=60)
GFR, Est Non African American: 126 mL/min/{1.73_m2} (ref >=60)
Globulin: 2.7 g/dL (calc) (ref 1.9–3.7)
Glucose, Bld: 99 mg/dL (ref 65–99)
Potassium: 4.7 mmol/L (ref 3.5–5.3)
Sodium: 141 mmol/L (ref 135–146)
Total Bilirubin: 0.9 mg/dL (ref 0.2–1.2)
Total Protein: 7.1 g/dL (ref 6.1–8.1)

## 2019-09-23 LAB — CBC WITH DIFFERENTIAL/PLATELET
Absolute Monocytes: 365 cells/uL (ref 200–950)
Basophils Absolute: 29 cells/uL (ref 0–200)
Basophils Relative: 0.6 %
Eosinophils Absolute: 82 cells/uL (ref 15–500)
Eosinophils Relative: 1.7 %
HCT: 46.4 % (ref 38.5–50.0)
Hemoglobin: 16.1 g/dL (ref 13.2–17.1)
Lymphs Abs: 1666 cells/uL (ref 850–3900)
MCH: 31.6 pg (ref 27.0–33.0)
MCHC: 34.7 g/dL (ref 32.0–36.0)
MCV: 91.2 fL (ref 80.0–100.0)
MPV: 11 fL (ref 7.5–12.5)
Monocytes Relative: 7.6 %
Neutro Abs: 2659 cells/uL (ref 1500–7800)
Neutrophils Relative %: 55.4 %
Platelets: 159 10*3/uL (ref 140–400)
RBC: 5.09 10*6/uL (ref 4.20–5.80)
RDW: 11.8 % (ref 11.0–15.0)
Total Lymphocyte: 34.7 %
WBC: 4.8 10*3/uL (ref 3.8–10.8)

## 2019-09-23 LAB — TSH: TSH: 1.9 mIU/L (ref 0.40–4.50)

## 2019-10-07 ENCOUNTER — Ambulatory Visit: Payer: 59 | Admitting: Family Medicine

## 2019-10-07 ENCOUNTER — Other Ambulatory Visit: Payer: Self-pay

## 2019-10-07 ENCOUNTER — Encounter: Payer: Self-pay | Admitting: Family Medicine

## 2019-10-07 VITALS — BP 122/64 | HR 91 | Temp 98.5°F | Resp 14 | Ht 65.0 in | Wt 196.0 lb

## 2019-10-07 DIAGNOSIS — R030 Elevated blood-pressure reading, without diagnosis of hypertension: Secondary | ICD-10-CM

## 2019-10-07 DIAGNOSIS — K219 Gastro-esophageal reflux disease without esophagitis: Secondary | ICD-10-CM | POA: Diagnosis not present

## 2019-10-07 DIAGNOSIS — R079 Chest pain, unspecified: Secondary | ICD-10-CM

## 2019-10-07 DIAGNOSIS — Z720 Tobacco use: Secondary | ICD-10-CM | POA: Diagnosis not present

## 2019-10-07 DIAGNOSIS — R002 Palpitations: Secondary | ICD-10-CM

## 2019-10-07 NOTE — Progress Notes (Signed)
Patient ID: Timothy Padilla, Timothy Padilla    DOB: 06/08/1991, 28 y.o.   MRN: [id number]  PCP: [name], Kayleeann Huxford, PA-C  Chief Complaint  Patient presents with  . Follow-up  . Chest Pain    improving  . Gastroesophageal Reflux    Subjective:   Timothy Padilla is a 28 y.o. Timothy Padilla, presents to clinic with CC of the following:  Pt here for f/up on CP seen 2 weeks ago, EKG was unremarkable, labs and CXR were normal, some of his CP I suspected was GERD, and pt started a PPI trial, is here for f/up. Overall his symptoms have drastically improved and he no longer is having any chest pain, belching, acid reflux or indigestion.  He has been compliant with taking Protonix 40 mg daily in the morning.  He has no longer having nighttime symptoms waking him up GERD: - Current medication regimen: protonix - Possible Triggers: smoking 1 PPD, caffeine use is moderate-to-high daily, foods, eating late, stress - Endorses: no current sx - Denies: abdominal pain, cough, weight loss, dysphagia, black stools, hematemesis, diarrhea or constipation, no longer waking up at night with sx, no indigestion   Palpitations: He only has had a few very brief episodes of feeling palpitations.  He states a few days ago he had been sitting for a while he stood up quickly to go walk somewhere and he felt palpitations and only lasted a few seconds he denies any associated symptoms specifically no associated sweats, chest pain, shortness of breath, near syncope.  We discussed any concerns with the brief palpitations and if he would like to see cardiology or not.  He states that so minor and brief that he does not feel like he needs a cardiac work-up at this time.   09/22/2019 HPI: On and off chest pain, central to left sided CP, onset randomly, feels like its "tense" tight, not always, but sometimes with radiation to his back. Getting worse over the past month.  Chest Pain  This is a new problem. The current episode started more than 1 month  ago (months). The onset quality is sudden. The problem occurs intermittently. The problem has been gradually worsening. The pain is present in the substernal region. The pain is at a severity of 5/10. Quality: tense, tight, sometimes sharp and quick, sometimes its lasted longer 30 min with more of an ache. Associated symptoms include diaphoresis, dizziness, exertional chest pressure and palpitations. Pertinent negatives include no abdominal pain, back pain, claudication, cough, fever, headaches, hemoptysis, irregular heartbeat, leg pain, lower extremity edema, malaise/fatigue, nausea, near-syncope, numbness, orthopnea, PND, shortness of breath, sputum production, syncope or weakness. Associated symptoms comments: Tremor with it, once or twice vision disturbances and lightheadedness.  Gastroesophageal Reflux He complains of belching, chest pain and heartburn. He reports no abdominal pain, no choking, no coughing, no dysphagia, no early satiety, no hoarse voice, no nausea or no wheezing. burning epigastric and indigestion. The problem has been gradually worsening. The heartburn is of severe intensity. The heartburn does not wake him from sleep. The symptoms are aggravated by certain foods and lying down (stopped ETOH). Associated symptoms include fatigue. Pertinent negatives include no anemia or melena. Risk factors include smoking/tobacco exposure, NSAIDs and caffeine use. He has tried an antacid (ginger) for the symptoms. The treatment provided mild relief.    Smoking cigarettes 1 -1.5 ppd  Family with hx of HTN, no cardiac hx in immediate family members    Patient Active Problem List   Diagnosis Date Noted  .  Tobacco abuse 01/11/2018  . Abnormal x-ray of thoracic spine 01/11/2018  . Elevated blood pressure reading 03/27/2016      Current Outpatient Medications:  .  pantoprazole (PROTONIX) 40 MG tablet, Take 1 tablet (40 mg total) by mouth daily before breakfast., Disp: 30 tablet, Rfl: 3    Allergies  Allergen Reactions  . Wellbutrin [Bupropion]     Tremor,fatigue,mood changes     Family History  Problem Relation Age of Onset  . Hypertension Father   . Heart attack Maternal Grandmother   . Hypertension Paternal Grandmother   . Stroke Paternal Grandmother   . COPD Paternal Grandfather      Social History   Socioeconomic History  . Marital status: Single    Spouse name: Not on file  . Number of children: Not on file  . Years of education: Not on file  . Highest education level: Not on file  Occupational History  . Not on file  Social Needs  . Financial resource strain: Not on file  . Food insecurity    Worry: Not on file    Inability: Not on file  . Transportation needs    Medical: Not on file    Non-medical: Not on file  Tobacco Use  . Smoking status: Current Every Day Smoker    Packs/day: 1.50    Years: 14.00    Pack years: 21.00    Types: Cigarettes    Start date: 01/07/2004  . Smokeless tobacco: Former Neurosurgeon    Types: Snuff  . Tobacco comment: just tried smokeless 2 times, does not currently use  Substance and Sexual Activity  . Alcohol use: Yes    Alcohol/week: 2.0 - 3.0 standard drinks    Types: 2 - 3 Cans of beer per week    Frequency: Never    Comment: per day  . Drug use: No  . Sexual activity: Not Currently    Partners: Female    Birth control/protection: Condom  Lifestyle  . Physical activity    Days per week: Not on file    Minutes per session: Not on file  . Stress: Not on file  Relationships  . Social Musician on phone: Not on file    Gets together: Not on file    Attends religious service: Not on file    Active member of club or organization: Not on file    Attends meetings of clubs or organizations: Not on file    Relationship status: Not on file  . Intimate partner violence    Fear of current or ex partner: Not on file    Emotionally abused: Not on file    Physically abused: Not on file    Forced sexual  activity: Not on file  Other Topics Concern  . Not on file  Social History Narrative  . Not on file   I personally reviewed active problem list, medication list, allergies, family history, social history, health maintenance, notes from last encounter, lab results, imaging with the patient/caregiver today.   Review of Systems  Constitutional: Negative.  Negative for activity change, appetite change, chills, diaphoresis, fatigue, fever and unexpected weight change.  HENT: Negative.   Eyes: Negative.   Respiratory: Negative.  Negative for cough, choking, chest tightness, shortness of breath, wheezing and stridor.   Cardiovascular: Positive for palpitations. Negative for chest pain and leg swelling.       Denies orthopnea, PND  Gastrointestinal: Negative.  Negative for abdominal distention, abdominal pain, anal  bleeding, blood in stool, constipation, diarrhea, nausea, rectal pain and vomiting.  Endocrine: Negative.   Genitourinary: Negative.   Musculoskeletal: Negative.   Skin: Negative.  Negative for color change.  Allergic/Immunologic: Negative.   Neurological: Negative.  Negative for dizziness, tremors, syncope, weakness, light-headedness, numbness and headaches.  Hematological: Negative.   Psychiatric/Behavioral: Negative.   All other systems reviewed and are negative.      Objective:   Vitals:   10/07/19 0918  BP: 122/64  Pulse: 91  Resp: 14  Temp: 98.5 F (36.9 C)  SpO2: 97%  Weight: 196 lb (88.9 kg)  Height:  (1.651 m)    Body mass index is 32.62 kg/m.  Physical Exam Vitals signs and nursing note reviewed.  Constitutional:      General: He is not in acute distress.    Appearance: Normal appearance. He is well-developed. He is not ill-appearing, toxic-appearing or diaphoretic.     Interventions: Face mask in place.  HENT:     Head: Normocephalic and atraumatic.     Jaw: No trismus.     Right Ear: External ear normal.     Left Ear: External ear normal.   Eyes:     General: Lids are normal. No scleral icterus.    Conjunctiva/sclera: Conjunctivae normal.     Pupils: Pupils are equal, round, and reactive to light.  Neck:     Musculoskeletal: Normal range of motion and neck supple.     Trachea: Trachea and phonation normal. No tracheal deviation.  Cardiovascular:     Rate and Rhythm: Normal rate and regular rhythm.     Pulses: Normal pulses.          Radial pulses are 2+ on the right side and 2+ on the left side.       Posterior tibial pulses are 2+ on the right side and 2+ on the left side.     Heart sounds: Normal heart sounds. No murmur. No friction rub. No gallop.   Pulmonary:     Effort: Pulmonary effort is normal. No respiratory distress.     Breath sounds: Normal breath sounds. No stridor. No wheezing, rhonchi or rales.  Abdominal:     General: Bowel sounds are normal. There is no distension.     Palpations: Abdomen is soft.     Tenderness: There is no abdominal tenderness. There is no guarding or rebound.  Musculoskeletal: Normal range of motion.     Right lower leg: No edema.     Left lower leg: No edema.  Skin:    General: Skin is warm and dry.     Capillary Refill: Capillary refill takes less than 2 seconds.     Coloration: Skin is not jaundiced or pale.     Findings: No rash.     Nails: There is no clubbing.   Neurological:     Mental Status: He is alert.     Cranial Nerves: No dysarthria or facial asymmetry.     Motor: No weakness, tremor or abnormal muscle tone.     Coordination: Coordination normal.     Gait: Gait normal.  Psychiatric:        Mood and Affect: Mood normal.        Speech: Speech normal.        Behavior: Behavior normal. Behavior is cooperative.      Results for orders placed or performed in visit on 09/22/19  CBC w/ Diff  Result Value Ref Range   WBC 4.8 3.8 -  10.8 Thousand/uL   RBC 5.09 4.20 - 5.80 Million/uL   Hemoglobin 16.1 13.2 - 17.1 g/dL   HCT 71.0 62.6 - 94.8 %   MCV 91.2 80.0 -  100.0 fL   MCH 31.6 27.0 - 33.0 pg   MCHC 34.7 32.0 - 36.0 g/dL   RDW 54.6 27.0 - 35.0 %   Platelets 159 140 - 400 Thousand/uL   MPV 11.0 7.5 - 12.5 fL   Neutro Abs 2,659 1,500 - 7,800 cells/uL   Lymphs Abs 1,666 850 - 3,900 cells/uL   Absolute Monocytes 365 200 - 950 cells/uL   Eosinophils Absolute 82 15 - 500 cells/uL   Basophils Absolute 29 0 - 200 cells/uL   Neutrophils Relative % 55.4 %   Total Lymphocyte 34.7 %   Monocytes Relative 7.6 %   Eosinophils Relative 1.7 %   Basophils Relative 0.6 %  CMP w GFR  Result Value Ref Range   Glucose, Bld 99 65 - 99 mg/dL   BUN 11 7 - 25 mg/dL   Creat 0.93 8.18 - 2.99 mg/dL   GFR, Est Non African American 126 > OR = 60 mL/min/1.39m2   GFR, Est African American 146 > OR = 60 mL/min/1.39m2   BUN/Creatinine Ratio NOT APPLICABLE 6 - 22 (calc)   Sodium 141 135 - 146 mmol/L   Potassium 4.7 3.5 - 5.3 mmol/L   Chloride 107 98 - 110 mmol/L   CO2 29 20 - 32 mmol/L   Calcium 9.5 8.6 - 10.3 mg/dL   Total Protein 7.1 6.1 - 8.1 g/dL   Albumin 4.4 3.6 - 5.1 g/dL   Globulin 2.7 1.9 - 3.7 g/dL (calc)   AG Ratio 1.6 1.0 - 2.5 (calc)   Total Bilirubin 0.9 0.2 - 1.2 mg/dL   Alkaline phosphatase (APISO) 65 36 - 130 U/L   AST 15 10 - 40 U/L   ALT 15 9 - 46 U/L  TSH  Result Value Ref Range   TSH 1.90 0.40 - 4.50 mIU/L        Assessment & Plan:     ICD-10-CM   1. Gastroesophageal reflux disease, unspecified whether esophagitis present  K21.9    well controlled sx, big improvement with protonix daily - discussed eventual weaning off PPI if able - can add H2 blockers, decrease dose, handout given  2. Chest pain, unspecified type  R07.9    resolved, his work up from two weeks ago was unremarkable - likely GERD  3. Tobacco abuse  Z72.0    continued smoking, discussed risks and encouraged him to decrease or quit  4. Elevated blood pressure reading  R03.0    BP reading normal today, likely elevated last visit due to his pain and/or anxiety  5.  Palpitations  R00.2    very mild and brief - seconds long, after rapid position changes - discussed physiology how that may be normal with quickly standing, drink more water, change positions a little more slowly if sitting for long periods of times.  Discussed and offered cardiology referral - but at this time his sx are minor, do not feel he HAS to proceed with any further cardiac work-up at this time especially since his very brief palpitations have no associated symptoms, patient agrees with waiting and monitoring.  Did encourage him to reach out to me immediately if he has any palpitations associated with near syncope, diaphoresis, chest pain, shortness of breath.  Discussed Holter monitors and other cardiac work-up if and when appropriate.  Return in about 6 months (around 04/06/2020) for Annual Physical.   Delsa Grana, PA-C 10/07/19 9:41 AM

## 2019-10-07 NOTE — Patient Instructions (Addendum)
Protonix daily for another couple weeks. After than see if you can skip doses.  If symptoms come back on days you skip doses then we will probably need to continue the medicine a little longer.   Can try pepcid or zantac over the counter to help you see if you can eventually come off of protonix.    Let me know if you are able to stop the medicine or decrease the dose.  Let me know if you want to try a lower dose.   Food Choices for Gastroesophageal Reflux Disease, Adult When you have gastroesophageal reflux disease (GERD), the foods you eat and your eating habits are very important. Choosing the right foods can help ease your discomfort. Think about working with a nutrition specialist (dietitian) to help you make good choices. What are tips for following this plan?  Meals  Choose healthy foods that are low in fat, such as fruits, vegetables, whole grains, low-fat dairy products, and lean meat, fish, and poultry.  Eat small meals often instead of 3 large meals a day. Eat your meals slowly, and in a place where you are relaxed. Avoid bending over or lying down until 2-3 hours after eating.  Avoid eating meals 2-3 hours before bed.  Avoid drinking a lot of liquid with meals.  Cook foods using methods other than frying. Bake, grill, or broil food instead.  Avoid or limit: ? Chocolate. ? Peppermint or spearmint. ? Alcohol. ? Pepper. ? Black and decaffeinated coffee. ? Black and decaffeinated tea. ? Bubbly (carbonated) soft drinks. ? Caffeinated energy drinks and soft drinks.  Limit high-fat foods such as: ? Fatty meat or fried foods. ? Whole milk, cream, butter, or ice cream. ? Nuts and nut butters. ? Pastries, donuts, and sweets made with butter or shortening.  Avoid foods that cause symptoms. These foods may be different for everyone. Common foods that cause symptoms include: ? Tomatoes. ? Oranges, lemons, and limes. ? Peppers. ? Spicy food. ? Onions and  garlic. ? Vinegar. Lifestyle  Maintain a healthy weight. Ask your doctor what weight is healthy for you. If you need to lose weight, work with your doctor to do so safely.  Exercise for at least 30 minutes for 5 or more days each week, or as told by your doctor.  Wear loose-fitting clothes.  Do not smoke. If you need help quitting, ask your doctor.  Sleep with the head of your bed higher than your feet. Use a wedge under the mattress or blocks under the bed frame to raise the head of the bed. Summary  When you have gastroesophageal reflux disease (GERD), food and lifestyle choices are very important in easing your symptoms.  Eat small meals often instead of 3 large meals a day. Eat your meals slowly, and in a place where you are relaxed.  Limit high-fat foods such as fatty meat or fried foods.  Avoid bending over or lying down until 2-3 hours after eating.  Avoid peppermint and spearmint, caffeine, alcohol, and chocolate. This information is not intended to replace advice given to you by your health care provider. Make sure you discuss any questions you have with your health care provider. Document Released: 04/22/2012 Document Revised: 02/12/2019 Document Reviewed: 11/27/2016 Elsevier Patient Education  2020 [employer].

## 2020-04-06 ENCOUNTER — Encounter: Payer: 59 | Admitting: Family Medicine

## 2020-04-11 NOTE — Progress Notes (Signed)
Patient ID: Timothy Padilla, male    DOB: [birth year], 29 y.o.   MRN: [id number]  PCP: Delsa Grana, PA-C  Chief Complaint  Patient presents with  . Knot on hands    left hand has a couple of knots and have been there for about a year, right hand has one spot that started a month ago and it is the only one that is painful    Subjective:   Timothy Padilla is a 29 y.o. male, presents to clinic with CC of the following:  Chief Complaint  Patient presents with  . Knot on hands    left hand has a couple of knots and have been there for about a year, right hand has one spot that started a month ago and it is the only one that is painful    HPI:  Patient is a 29 year old male patient of Delsa Grana Last visit with her was in December 2020 Returns today with complaints of spots on his left hand, and more recent pain in his right index finger with swelling  He notes he had a small spot on his left hand that is questionably getting a little bigger in the recent past, and a very tiny 1 past arose adjacent.  They are not painful.  Thought he would have it looked at today while here for his right finger.  About a week ago, his right index finger was more painful past the PIP joint, and then he developed more swelling and pain over the very distal joint (DIP joint).  He denied any major one-time trauma before this started, although is very active with his hands and activities with work. He notes the swelling has since gone down significantly, and it is less painful, although still has a raised area over the DIP joint and is not yet fully able to flex the joint with with some stiffness persisting.  He works for [employer] in [place].    Patient Active Problem List   Diagnosis Date Noted  . Tobacco abuse 01/11/2018  . Abnormal x-ray of thoracic spine 01/11/2018  . Elevated blood pressure reading 03/27/2016     No current outpatient medications on file.   Allergies    Allergen Reactions  . Wellbutrin [Bupropion]     Tremor,fatigue,mood changes     Past Surgical History:  Procedure Laterality Date  . SCAPHOID FRACTURE SURGERY Left 2012     Family History  Problem Relation Age of Onset  . Hypertension Father   . Heart attack Maternal Grandmother   . Hypertension Paternal Grandmother   . Stroke Paternal Grandmother   . COPD Paternal Grandfather      Social History   Tobacco Use  . Smoking status: Current Every Day Smoker    Packs/day: 1.50    Years: 14.00    Pack years: 21.00    Types: Cigarettes    Start date: 01/07/2004  . Smokeless tobacco: Former Systems developer    Types: Snuff  . Tobacco comment: just tried smokeless 2 times, does not currently use  Substance Use Topics  . Alcohol use: Yes    Alcohol/week: 2.0 - 3.0 standard drinks    Types: 2 - 3 Cans of beer per week    Comment: per day    With staff assistance, above reviewed with the patient today.  ROS: As per HPI, otherwise no specific complaints on a limited and focused system review   No results found for this or any  previous visit (from the past 72 hour(s)).   PHQ2/9: Depression screen [facility] 2/9 04/12/2020 09/22/2019 12/04/2018 02/07/2018 01/06/2018  Decreased Interest - 0 0 0 0  Down, Depressed, Hopeless 0 0 0 0 0  PHQ - 2 Score 0 0 0 0 0  Altered sleeping 0 0 0 - -  Tired, decreased energy 0 0 0 - -  Change in appetite 0 0 0 - -  Feeling bad or failure about yourself  0 0 0 - -  Trouble concentrating 0 0 0 - -  Moving slowly or fidgety/restless 0 0 0 - -  Suicidal thoughts 0 0 0 - -  PHQ-9 Score 0 0 0 - -  Difficult doing work/chores Not difficult at all Not difficult at all Not difficult at all - -   PHQ-2/9 Result is neg  Fall Risk: Fall Risk  04/12/2020 09/22/2019 12/04/2018 02/07/2018 01/06/2018  Falls in the past year? 0 0 0 No No  Number falls in past yr: 0 0 0 - -  Injury with Fall? 0 0 0 - -      Objective:   Vitals:   04/12/20 0932  BP: 132/88  Pulse: 76   Resp: 16  Temp: 98.5 F (36.9 C)  TempSrc: Temporal  SpO2: 100%  Weight: 206 lb 6.4 oz (93.6 kg)  Height: 5\' 5"  (1.651 m)    Body mass index is 34.35 kg/m.  Physical Exam   NAD, masked HEENT - [place]/AT, sclera anicteric,  Skin-a very small slightly raised circular lesion was present on the dorsum of the left hand with a very tiny slightly raised lesion adjacent, with no erythema or blistering component.  Did note likely early work type processes on the hand. Ext -the right index finger had mild more superficial swelling on the dorsal aspect of the DIP joint with the raised erythematous area like a pimple trying to come to a head, he was not markedly tender to palpate this area.  The DIP joint had good motion, with just a slight limitation in the extreme of flexion, could fully extend.  He had good motion of the PIP and MCP joint.  No erythema or swelling of these joints noted.  The nail was intact. Neuro/psychiatric - affect was not flat, appropriate with conversation  Alert, speech was normal  Results for orders placed or performed in visit on 09/22/19  CBC w/ Diff  Result Value Ref Range   WBC 4.8 3.8 - 10.8 Thousand/uL   RBC 5.09 4.20 - 5.80 Million/uL   Hemoglobin 16.1 13.2 - 17.1 g/dL   HCT 09/24/19 29.7 - 98.9 %   MCV 91.2 80.0 - 100.0 fL   MCH 31.6 27.0 - 33.0 pg   MCHC 34.7 32.0 - 36.0 g/dL   RDW 21.1 94.1 - 74.0 %   Platelets 159 140 - 400 Thousand/uL   MPV 11.0 7.5 - 12.5 fL   Neutro Abs 2,659 1,500 - 7,800 cells/uL   Lymphs Abs 1,666 850 - 3,900 cells/uL   Absolute Monocytes 365 200 - 950 cells/uL   Eosinophils Absolute 82 15 - 500 cells/uL   Basophils Absolute 29 0 - 200 cells/uL   Neutrophils Relative % 55.4 %   Total Lymphocyte 34.7 %   Monocytes Relative 7.6 %   Eosinophils Relative 1.7 %   Basophils Relative 0.6 %  CMP w GFR  Result Value Ref Range   Glucose, Bld 99 65 - 99 mg/dL   BUN 11 7 - 25 mg/dL  Creat 0.72 0.60 - 1.35 mg/dL   GFR, Est Non African  American 126 > OR = 60 mL/min/1.66m2   GFR, Est African American 146 > OR = 60 mL/min/1.59m2   BUN/Creatinine Ratio NOT APPLICABLE 6 - 22 (calc)   Sodium 141 135 - 146 mmol/L   Potassium 4.7 3.5 - 5.3 mmol/L   Chloride 107 98 - 110 mmol/L   CO2 29 20 - 32 mmol/L   Calcium 9.5 8.6 - 10.3 mg/dL   Total Protein 7.1 6.1 - 8.1 g/dL   Albumin 4.4 3.6 - 5.1 g/dL   Globulin 2.7 1.9 - 3.7 g/dL (calc)   AG Ratio 1.6 1.0 - 2.5 (calc)   Total Bilirubin 0.9 0.2 - 1.2 mg/dL   Alkaline phosphatase (APISO) 65 36 - 130 U/L   AST 15 10 - 40 U/L   ALT 15 9 - 46 U/L  TSH  Result Value Ref Range   TSH 1.90 0.40 - 4.50 mIU/L       Assessment & Plan:   1. Other viral warts Discussed with patient that the small spots on the left hand are likely early wart type process, are very small presently, and would closely monitor presently. If increasing in size, more developing, then follow-up again, and sometimes we use liquid nitrogen topically to try to help remove.  There are over-the-counter products that sometimes can help as well. Also showed him the ABCDEs on a card of more concerning lesions that are worrisome as precancerous, and helped reassure that his do not fall into this category.  2. Superficial skin infection Do feel he has a resolving superficial skin infection of his index finger, with trying to come to ahead presently.  Did note it may open up and drain, and also may drain internally as improves. Recommended warm soaks, especially at nighttime when home. Also noted if more swollen and uncomfortable, can apply some cold after the soaks to help lessen inflammation. Do expect this to resolve, and did note that sometimes the swelling in the distal finger joint can persist for weeks to months after. Also noted if it does open up and drain some, to keep loosely covered at that point.   Also recommended keeping the area clean. To follow-up if again worsening over time. He asked about trying to pop  that area, and I recommended not doing so presently to help lessen risk of secondary infection over time given his activity levels.      Jamelle Haring, MD 04/12/20 9:51 AM

## 2020-04-12 ENCOUNTER — Other Ambulatory Visit: Payer: Self-pay

## 2020-04-12 ENCOUNTER — Ambulatory Visit: Payer: 59 | Admitting: Internal Medicine

## 2020-04-12 ENCOUNTER — Encounter: Payer: Self-pay | Admitting: Internal Medicine

## 2020-04-12 VITALS — BP 132/88 | HR 76 | Temp 98.5°F | Resp 16 | Ht 65.0 in | Wt 206.4 lb

## 2020-04-12 DIAGNOSIS — B078 Other viral warts: Secondary | ICD-10-CM | POA: Diagnosis not present

## 2020-04-12 DIAGNOSIS — L089 Local infection of the skin and subcutaneous tissue, unspecified: Secondary | ICD-10-CM | POA: Diagnosis not present

## 2020-04-12 NOTE — Patient Instructions (Signed)
Recommend warm soaks, mostly at nighttime when home  Also can apply some ice to the area if more painful and swollen as discussed

## 2020-04-28 ENCOUNTER — Other Ambulatory Visit: Payer: Self-pay

## 2020-04-28 ENCOUNTER — Encounter: Payer: Self-pay | Admitting: Family Medicine

## 2020-04-28 ENCOUNTER — Ambulatory Visit (INDEPENDENT_AMBULATORY_CARE_PROVIDER_SITE_OTHER): Payer: 59 | Admitting: Family Medicine

## 2020-04-28 VITALS — BP 118/82 | HR 99 | Temp 97.5°F | Resp 16 | Ht 70.0 in | Wt 204.1 lb

## 2020-04-28 DIAGNOSIS — Z0000 Encounter for general adult medical examination without abnormal findings: Secondary | ICD-10-CM

## 2020-04-28 DIAGNOSIS — L989 Disorder of the skin and subcutaneous tissue, unspecified: Secondary | ICD-10-CM | POA: Diagnosis not present

## 2020-04-28 DIAGNOSIS — Z1329 Encounter for screening for other suspected endocrine disorder: Secondary | ICD-10-CM

## 2020-04-28 DIAGNOSIS — Z13220 Encounter for screening for lipoid disorders: Secondary | ICD-10-CM

## 2020-04-28 DIAGNOSIS — Z8249 Family history of ischemic heart disease and other diseases of the circulatory system: Secondary | ICD-10-CM

## 2020-04-28 DIAGNOSIS — M7989 Other specified soft tissue disorders: Secondary | ICD-10-CM | POA: Diagnosis not present

## 2020-04-28 DIAGNOSIS — Z720 Tobacco use: Secondary | ICD-10-CM

## 2020-04-28 DIAGNOSIS — Z13228 Encounter for screening for other metabolic disorders: Secondary | ICD-10-CM

## 2020-04-28 DIAGNOSIS — Z130 Encounter for screening for diseases of the blood and blood-forming organs and certain disorders involving the immune mechanism: Secondary | ICD-10-CM

## 2020-04-28 DIAGNOSIS — K219 Gastro-esophageal reflux disease without esophagitis: Secondary | ICD-10-CM | POA: Insufficient documentation

## 2020-04-28 DIAGNOSIS — R0789 Other chest pain: Secondary | ICD-10-CM

## 2020-04-28 LAB — COMPLETE METABOLIC PANEL WITH GFR
AG Ratio: 1.6 (calc) (ref 1.0–2.5)
ALT: 18 U/L (ref 9–46)
AST: 15 U/L (ref 10–40)
Albumin: 4.6 g/dL (ref 3.6–5.1)
Alkaline phosphatase (APISO): 64 U/L (ref 36–130)
BUN: 14 mg/dL (ref 7–25)
CO2: 29 mmol/L (ref 20–32)
Calcium: 9.8 mg/dL (ref 8.6–10.3)
Chloride: 105 mmol/L (ref 98–110)
Creat: 0.77 mg/dL (ref 0.60–1.35)
GFR, Est African American: 142 mL/min/{1.73_m2} (ref >=60)
GFR, Est Non African American: 123 mL/min/{1.73_m2} (ref >=60)
Globulin: 2.9 g/dL (calc) (ref 1.9–3.7)
Glucose, Bld: 88 mg/dL (ref 65–99)
Potassium: 4.3 mmol/L (ref 3.5–5.3)
Sodium: 140 mmol/L (ref 135–146)
Total Bilirubin: 1.1 mg/dL (ref 0.2–1.2)
Total Protein: 7.5 g/dL (ref 6.1–8.1)

## 2020-04-28 LAB — LIPID PANEL
Cholesterol: 155 mg/dL (ref <200)
HDL: 48 mg/dL (ref >=40)
LDL Cholesterol (Calc): 93 mg/dL (calc)
Non-HDL Cholesterol (Calc): 107 mg/dL (calc) (ref <130)
Total CHOL/HDL Ratio: 3.2 (calc) (ref <5.0)
Triglycerides: 58 mg/dL (ref <150)

## 2020-04-28 MED ORDER — PREDNISONE 20 MG PO TABS: ORAL_TABLET | ORAL | 0 refills | Status: DC

## 2020-04-28 NOTE — Progress Notes (Signed)
Patient: Timothy Padilla, Male    DOB: 04/15/1991, 29 y.o.   MRN: [id number] Timothy Berryapia, Timothy Geyer, PA-C Visit Date: 04/28/2020  Today's Provider: Danelle BerryLeisa Alfons Sulkowski, PA-C   Chief Complaint  Patient presents with  . Annual Exam   Subjective:   Annual physical exam:  Timothy Padilla is a 29 y.o. male who presents today for health maintenance and annual & complete physical exam.   He feels pretty good for the most part - a little tired He reports exercising - walking, hikes, does a lot of physical and strenuous work with job 45-50 hours  Diet - trying to add in fruits and berries  He reports he is sleeping fairly well.  Right index finger pain, swelling and infection? not improved- he saw Dr. Rexene EdisonH on 04/12/2020 after over a month of right index finger pain from PIP to tip, he has been soaking, the PIP joint has improved, is less painful and swollen but he still has redness, swelling and multiple papules (warts?)   Chest pain recurrent left sided sharp intermittent pain, feels located under armpit to left peck area, occurs randomly and sometimes when bending over.  No radiation to jaw or arm, denies any pressure/squeeze, no associated cough, wheeze, SOB, Ha, near syncope, diaphoresis Pain is brief, does not occur with exertion, he reports it has occurred several times while talking in exam room today, lasts a second or two, makes him feel nervous  GERD a little better not as much reflux, stopped ETOH, he felt better, he took protonix for a month, but was able to stop meds and still had resolved epigastric pain, burning, and reflux, and he had more energy.  Still smoking - he's considering cutting back or quitting again, he does want to, he is worried about his CP and smoking 2ppd 1.5 - 2 ppd for about 14 years - smoking 2ppd more recently No mood disorder or seizure disorder Did not do well with wellbutrin Has not tried patches or gum Smoking cessation instruction/counseling given:  counseled patient on the  dangers of tobacco use, advised patient to stop smoking, and reviewed strategies to maximize success Offered to prescribe chantix, referred to smoking cessation program (pamphlet given) with [facility] and Glenna FellowsShawn Padilla Pt will call insurance to see what they cover- discussed smoking cessation/tobacco counseling for about 10 min today  Pt wished to discuss acute complaints and f/up do routine f/up on chronic conditions.  Advised pt of separate billing and coding - he read the poster on the wall today about "sick" vs "well" visits and asked that we address both today  USPSTF grade A and B recommendations - reviewed and addressed today  Depression:  Phq 9 completed today by patient, was reviewed by me with patient in the room, score is  negative, pt feels  PHQ 2/9 Scores 04/28/2020 04/12/2020 09/22/2019 12/04/2018  PHQ - 2 Score 0 0 0 0  PHQ- 9 Score 2 0 0 0   [facility] 04/28/2020 04/12/2020 09/22/2019 12/04/2018 02/07/2018  Decreased Interest 0 - 0 0 0  Down, Depressed, Hopeless 0 0 0 0 0  PHQ - 2 Score 0 0 0 0 0  Altered sleeping 1 0 0 0 -  Tired, decreased energy 1 0 0 0 -  Change in appetite 0 0 0 0 -  Feeling bad or failure about yourself  0 0 0 0 -  Trouble concentrating 0 0 0 0 -  Moving slowly or fidgety/restless 0 0 0 0 -  Suicidal thoughts 0 0 0 0 -  PHQ-9 Score 2 0 0 0 -  Difficult doing work/chores Not difficult at all Not difficult at all Not difficult at all Not difficult at all -    Hep C Screening: declines STD testing and  prevention (HIV/chl/gon/syphilis):   HIV declined and one partner  Prostate cancer:  No family hx No results found for: PSA Intimate partner violence:   Fiance - feels safe at home   Skin cancer:   Pt reports no hx of skin cancer, suspicious lesions/biopsies in the past.  Colorectal cancer:  colonoscopy is not due per age, no sx, denies melena, hematochezia   Lung cancer:   Low Dose CT Chest recommended if Age 28-80 years, 30 pack-year  currently smoking OR have quit w/in 15years. Patient does not qualify.     Social History   Tobacco Use  . Smoking status: Current Every Day Smoker    Packs/day: 1.50    Years: 14.00    Pack years: 21.00    Types: Cigarettes    Start date: 01/07/2004  . Smokeless tobacco: Former Neurosurgeon    Types: Snuff  . Tobacco comment: just tried smokeless 2 times, does not currently use  Substance Use Topics  . Alcohol use: Yes    Alcohol/week: 2.0 - 3.0 standard drinks    Types: 2 - 3 Cans of beer per week    Comment: per day     Alcohol screening:   [facility]/28/[facility] in [facility]  AUDIT-C Score 2      AAA:  The USPSTF recommends one-time screening with ultrasonography in men ages 45 to 55 years who have ever smoked  ECG: none today  Blood pressure/Hypertension: BP Readings from Last 3 Encounters:  04/28/20 118/82  04/12/20 132/88  10/07/19 122/64   Weight/Obesity: Wt Readings from Last 3 Encounters:  04/28/20 204 lb 1.6 oz (92.6 kg)  04/12/20 206 lb 6.4 oz (93.6 kg)  10/07/19 196 lb (88.9 kg)   BMI Readings from Last 3 Encounters:  04/28/20 29.29 kg/m  04/12/20 34.35 kg/m  10/07/19 32.62 kg/m    Lipids:  No results found for: CHOL No results found for: HDL No results found for: LDLCALC No results found for: TRIG No results found for: CHOLHDL No results found for: LDLDIRECT Based on the results of lipid panel his/her cardiovascular risk factor ( using [place] Cohort )  in the next 10 years is : The ASCVD Risk score Denman George DC Jr., et al., 2013) failed to calculate for the following reasons:   The 2013 ASCVD risk score is only valid for ages 44 to 33 Glucose:  Glucose  Date Value Ref Range Status  03/27/2016 91 65 - 99 mg/dL Final   Glucose, Bld  Date Value Ref Range Status  09/22/2019 99 65 - 99 mg/dL Final    Comment:    .            Fasting reference interval .   03/14/2017 86 65 - 99 mg/dL Final    Social History         Social History   Socioeconomic History  . Marital status: Single    Spouse name: Not on file  . Number of children: Not on file  . Years of education: Not on file  . Highest education level: Not on file  Occupational History  . Not on file  Tobacco Use  . Smoking status: Current Every Day Smoker  Packs/day: 1.50    Years: 14.00    Pack years: 21.00    Types: Cigarettes    Start date: 01/07/2004  . Smokeless tobacco: Former Neurosurgeon    Types: Snuff  . Tobacco comment: just tried smokeless 2 times, does not currently use  Vaping Use  . Vaping Use: Never used  Substance and Sexual Activity  . Alcohol use: Yes    Alcohol/week: 2.0 - 3.0 standard drinks    Types: 2 - 3 Cans of beer per week    Comment: per day  . Drug use: No  . Sexual activity: Not Currently    Partners: Female    Birth control/protection: Condom  Other Topics Concern  . Not on file  Social History Narrative  . Not on file   Social Determinants of Health   Financial Resource Strain:   . Difficulty of Paying Living Expenses:   Food Insecurity:   . Worried About Programme researcher, broadcasting/film/video in the Last Year:   . Barista in the Last Year:   Transportation Needs:   . Freight forwarder (Medical):   Marland Kitchen Lack of Transportation (Non-Medical):   Physical Activity:   . Days of Exercise per Week:   . Minutes of Exercise per Session:   Stress:   . Feeling of Stress :   Social Connections:   . Frequency of Communication with Friends and Family:   . Frequency of Social Gatherings with Friends and Family:   . Attends Religious Services:   . Active Member of Clubs or Organizations:   . Attends Banker Meetings:   Marland Kitchen Marital Status:            Family History        Family History  Problem Relation Age of Onset  . Hypertension Father   . Heart attack Maternal Grandmother   . Hypertension Paternal Grandmother   . Stroke Paternal Grandmother   . COPD Paternal Grandfather     Patient Active  Problem List   Diagnosis Date Noted  . Other viral warts 04/12/2020  . Tobacco abuse 01/11/2018  . Abnormal x-ray of thoracic spine 01/11/2018  . Elevated blood pressure reading 03/27/2016    Past Surgical History:  Procedure Laterality Date  . SCAPHOID FRACTURE SURGERY Left 2012    No current outpatient medications on file.  Allergies  Allergen Reactions  . Wellbutrin [Bupropion]     Tremor,fatigue,mood changes    Patient Care Team: Timothy Berry, PA-C as PCP - General (Family Medicine)  I personally reviewed active problem list, medication list, allergies, family history, social history, health maintenance, notes from last encounter, lab results, imaging with the patient/caregiver today.   Review of Systems  Constitutional: Negative.  Negative for activity change, appetite change, fatigue and unexpected weight change.  HENT: Negative.   Eyes: Negative.   Respiratory: Negative.  Negative for apnea, cough, choking, chest tightness, shortness of breath, wheezing and stridor.   Cardiovascular: Positive for chest pain. Negative for palpitations and leg swelling.  Gastrointestinal: Negative.  Negative for abdominal distention, abdominal pain, blood in stool, constipation, diarrhea, nausea and vomiting.  Endocrine: Negative.   Genitourinary: Negative.  Negative for decreased urine volume, difficulty urinating, testicular pain and urgency.  Musculoskeletal: Negative.   Skin: Negative.  Negative for color change and pallor.  Allergic/Immunologic: Negative.   Neurological: Negative.  Negative for syncope, weakness, light-headedness and numbness.  Hematological: Negative.   Psychiatric/Behavioral: Negative.  Negative for confusion, dysphoric mood, self-injury  and suicidal ideas. The patient is not nervous/anxious.   All other systems reviewed and are negative.         Objective:   Vitals:  Vitals:   04/28/20 1521  BP: 118/82  Pulse: 99  Resp: 16  Temp: (!) 97.5 F (36.4  C)  TempSrc: Temporal  SpO2: 98%  Weight: 204 lb 1.6 oz (92.6 kg)  Height: 5\' 10"  (1.778 m)    Body mass index is 29.29 kg/m.  Physical Exam Vitals and nursing note reviewed.  Constitutional:      General: He is not in acute distress.    Appearance: Normal appearance. He is well-developed and normal weight. He is not ill-appearing, toxic-appearing or diaphoretic.     Interventions: Face mask in place.  HENT:     Head: Normocephalic and atraumatic.     Jaw: No trismus.     Right Ear: External ear normal.     Left Ear: External ear normal.  Eyes:     General: Lids are normal. No scleral icterus.    Conjunctiva/sclera: Conjunctivae normal.     Pupils: Pupils are equal, round, and reactive to light.  Neck:     Trachea: Trachea and phonation normal. No tracheal deviation.  Cardiovascular:     Rate and Rhythm: Normal rate and regular rhythm.     Chest Wall: PMI is not displaced. No thrill.     Pulses: Normal pulses.          Radial pulses are 2+ on the right side and 2+ on the left side.       Posterior tibial pulses are 2+ on the right side and 2+ on the left side.     Heart sounds: Normal heart sounds. No murmur heard.  No friction rub. No gallop.   Pulmonary:     Effort: Pulmonary effort is normal. No tachypnea, accessory muscle usage, prolonged expiration, respiratory distress or retractions.     Breath sounds: Normal breath sounds. No stridor or transmitted upper airway sounds. No decreased breath sounds, wheezing, rhonchi or rales.  Chest:     Chest wall: No mass, swelling, tenderness, crepitus or edema.  Abdominal:     General: Bowel sounds are normal. There is no distension.     Palpations: Abdomen is soft.     Tenderness: There is no abdominal tenderness. There is no guarding or rebound.  Musculoskeletal:     Right hand: Swelling and tenderness present. Decreased range of motion.     Cervical back: Normal range of motion and neck supple.     Right lower leg: No  edema.     Left lower leg: No edema.     Comments: Right index finger from DIP to around medial nail margin mild edema and erythema with two papules, mildly ttp, hands/nails very dirty Tax inspector), no active drainage  PIP normal appearing  Skin:    General: Skin is warm and dry.     Capillary Refill: Capillary refill takes less than 2 seconds.     Coloration: Skin is not jaundiced.     Findings: No rash.     Nails: There is no clubbing.  Neurological:     Mental Status: He is alert.     Cranial Nerves: No dysarthria or facial asymmetry.     Motor: No tremor or abnormal muscle tone.     Gait: Gait normal.  Psychiatric:        Mood and Affect: Mood normal.  Speech: Speech normal.        Behavior: Behavior normal. Behavior is cooperative.      PHQ2/9: Depression screen [facility] 2/9 04/28/2020 04/12/2020 09/22/2019 12/04/2018 02/07/2018  Decreased Interest 0 - 0 0 0  Down, Depressed, Hopeless 0 0 0 0 0  PHQ - 2 Score 0 0 0 0 0  Altered sleeping 1 0 0 0 -  Tired, decreased energy 1 0 0 0 -  Change in appetite 0 0 0 0 -  Feeling bad or failure about yourself  0 0 0 0 -  Trouble concentrating 0 0 0 0 -  Moving slowly or fidgety/restless 0 0 0 0 -  Suicidal thoughts 0 0 0 0 -  PHQ-9 Score 2 0 0 0 -  Difficult doing work/chores Not difficult at all Not difficult at all Not difficult at all Not difficult at all -    Fall Risk: Fall Risk  04/28/2020 04/12/2020 09/22/2019 12/04/2018 02/07/2018  Falls in the past year? 0 0 0 0 No  Number falls in past yr: 0 0 0 0 -  Injury with Fall? 0 0 0 0 -    Functional Status Survey: Is the patient deaf or have difficulty hearing?: No Does the patient have difficulty seeing, even when wearing glasses/contacts?: No Does the patient have difficulty concentrating, remembering, or making decisions?: No Does the patient have difficulty walking or climbing stairs?: No Does the patient have difficulty dressing or bathing?: No Does the patient have difficulty  doing errands alone such as visiting a doctor's office or shopping?: No   Assessment & Plan:    CPE completed today  . Prostate cancer screening and PSA options (with potential risks and benefits of testing vs not testing) were discussed along with recent recs/guidelines, shared decision making and handout/information given to pt today  . USPSTF grade A and B recommendations reviewed with patient; age-appropriate recommendations, preventive care, screening tests, etc discussed and encouraged; healthy living encouraged; see AVS for patient education given to patient  . Discussed importance of 150 minutes of physical activity weekly, AHA exercise recommendations given to pt in AVS/handout  . Discussed importance of healthy diet:  eating lean meats and proteins, avoiding trans fats and saturated fats, avoid simple sugars and excessive carbs in diet, eat 6 servings of fruit/vegetables daily and drink plenty of water and avoid sweet beverages.  DASH diet reviewed if pt has HTN  . Recommended pt to do annual eye exam and routine dental exams/cleanings  . Reviewed Health Maintenance: Health Maintenance  Topic Date Due  . Hepatitis C Screening  Never done  . HIV Screening  Never done  . COVID-19 Vaccine (1) 05/14/2020 (Originally 05/05/2002)  . INFLUENZA VACCINE  06/05/2020  . TETANUS/TDAP  04/06/2029    . Immunizations: Immunization History  Administered Date(s) Administered  . Tdap 04/07/2019       ICD-10-CM   1. Adult general medical exam  Z00.00 COMPLETE METABOLIC PANEL WITH GFR    Lipid panel   refuses some of screening, labs done recently were good, screen for lipids with family hx, and lipids never done   2. Lesion of finger  L98.9 Ambulatory referral to Hand Surgery   papules? vesicles to right distal 2nd finger medial aspect, herpetic whitlow? possible viral warts? roughly 54mm diameter, 2 lesions raised erythematous  3. Swelling of right index finger  M79.89 Ambulatory  referral to Hand Surgery   right 2nd distal finger edematous and erythematous, tender, not improving x 2  months, compliant with warm soaks  4. Family history of heart disease  Z82.49 Lipid panel   screen lipids with family hx, pt heavy smoker, having CP  5. Screening for lipoid disorders  Z13.220 COMPLETE METABOLIC PANEL WITH GFR    Lipid panel  6. Screening for endocrine, metabolic and immunity disorder  Z13.29    Z13.228    Z13.0   7. Atypical chest pain  R07.89 COMPLETE METABOLIC PANEL WITH GFR  Sharp intermittent CP, history not concerning for ACS, no CP with exercise We previously did CXR and EKG which were unremarkable He is a heavy smoker - he would like to cut back-  Bronchitis in ddx, but unlikely w/o wheeze, cough, SOB BS good throughout, no increased WOB May be pleurisy?  Trial of prednisone burst Encouraged to reduce amount of smoking  Red flags reviewed -  Currently no concerning associated cardiac sx If no improvement may need to try inhalers? Have pulm or cardiology evaluate  I do not see indication today to repeat CXR or EKG May also be MSK? Though not reproducible with palpation on exam today   Lipid panel   8. Tobacco abuse  Z72.0    heavy smoker, currently 2 ppd  9. Gastroesophageal reflux disease, unspecified whether esophagitis present  K21.9    improved with protonix and avoiding alcohol, currently doing well w/o protonix, no abd pain, continue lifestyle/diet efforts to avoid triggers       Timothy Berry, PA-C 04/28/20 3:45 PM  [facility]

## 2020-04-28 NOTE — Patient Instructions (Addendum)
Preventive Care 48-30 Years Old, Male Preventive care refers to lifestyle choices and visits with your health care provider that can promote health and wellness. This includes:  A yearly physical exam. This is also called an annual well check.  Regular dental and eye exams.  Immunizations.  Screening for certain conditions.  Healthy lifestyle choices, such as eating a healthy diet, getting regular exercise, not using drugs or products that contain nicotine and tobacco, and limiting alcohol use. What can I expect for my preventive care visit? Physical exam Your health care provider will check:  Height and weight. These may be used to calculate body mass index (BMI), which is a measurement that tells if you are at a healthy weight.  Heart rate and blood pressure.  Your skin for abnormal spots. Counseling Your health care provider may ask you questions about:  Alcohol, tobacco, and drug use.  Emotional well-being.  Home and relationship well-being.  Sexual activity.  Eating habits.  Work and work Statistician. What immunizations do I need?  Influenza (flu) vaccine  This is recommended every year. Tetanus, diphtheria, and pertussis (Tdap) vaccine  You may need a Td booster every 10 years. Varicella (chickenpox) vaccine  You may need this vaccine if you have not already been vaccinated. Human papillomavirus (HPV) vaccine  If recommended by your health care provider, you may need three doses over 6 months. Measles, mumps, and rubella (MMR) vaccine  You may need at least one dose of MMR. You may also need a second dose. Meningococcal conjugate (MenACWY) vaccine  One dose is recommended if you are 47-13 years old and a Market researcher living in a residence hall, or if you have one of several medical conditions. You may also need additional booster doses. Pneumococcal conjugate (PCV13) vaccine  You may need this if you have certain conditions and were not  previously vaccinated. Pneumococcal polysaccharide (PPSV23) vaccine  You may need one or two doses if you smoke cigarettes or if you have certain conditions. Hepatitis A vaccine  You may need this if you have certain conditions or if you travel or work in places where you may be exposed to hepatitis A. Hepatitis B vaccine  You may need this if you have certain conditions or if you travel or work in places where you may be exposed to hepatitis B. Haemophilus influenzae type b (Hib) vaccine  You may need this if you have certain risk factors. You may receive vaccines as individual doses or as more than one vaccine together in one shot (combination vaccines). Talk with your health care provider about the risks and benefits of combination vaccines. What tests do I need? Blood tests  Lipid and cholesterol levels. These may be checked every 5 years starting at age 5.  Hepatitis C test.  Hepatitis B test. Screening   Diabetes screening. This is done by checking your blood sugar (glucose) after you have not eaten for a while (fasting).  Sexually transmitted disease (STD) testing. Talk with your health care provider about your test results, treatment options, and if necessary, the need for more tests. Follow these instructions at home: Eating and drinking   Eat a diet that includes fresh fruits and vegetables, whole grains, lean protein, and low-fat dairy products.  Take vitamin and mineral supplements as recommended by your health care provider.  Do not drink alcohol if your health care provider tells you not to drink.  If you drink alcohol: ? Limit how much you have to 0-2  drinks a day. ? Be aware of how much alcohol is in your drink. In the [place] one drink equals one 12 oz bottle of beer (355 mL), one 5 oz glass of wine (148 mL), or one 1 oz glass of hard liquor (44 mL). Lifestyle  Take daily care of your teeth and gums.  Stay active. Exercise for at least 30 minutes on 5 or  more days each week.  Do not use any products that contain nicotine or tobacco, such as cigarettes, e-cigarettes, and chewing tobacco. If you need help quitting, ask your health care provider.  If you are sexually active, practice safe sex. Use a condom or other form of protection to prevent STIs (sexually transmitted infections). What's next?  Go to your health care provider once a year for a well check visit.  Ask your health care provider how often you should have your eyes and teeth checked.  Stay up to date on all vaccines. This information is not intended to replace advice given to you by your health care provider. Make sure you discuss any questions you have with your health care provider. Document Revised: 10/16/2018 Document Reviewed: 10/16/2018 Elsevier Patient Education  [place] is irritation and swelling (inflammation) of the linings of your lungs (pleura). This can cause pain in your chest, back, or shoulder. It can also cause trouble breathing. Follow these instructions at home: Medicines  Take over-the-counter and prescription medicines only as told by your doctor.  If you were prescribed antibiotic medicine, take it as told by your doctor. Do not stop taking the antibiotic even if you start to feel better. Activity  Rest and return to your normal activities as told by your doctor. Ask your doctor what activities are safe for you.  Do not drive or use heavy machinery while taking prescription pain medicine. General instructions   Watch for any changes in your condition.  Take deep breaths often, even if it is painful. This can help prevent lung problems.  When lying down, lie on your painful side. This may help you feel less pain.  Do not smoke. If you need help quitting, ask your doctor.  Keep all follow-up visits as told by your doctor. This is important. Contact a doctor if:  You have pain that: ? Gets worse. ? Does not  get better with medicine. ? Lasts for more than 1 week.  You have a fever or chills.  You have a cough that does not get better at home.  You have trouble breathing that does not get better at home.  You cough up liquid that looks like pus (purulent secretions). Get help right away if:  Your lips, fingernails, or toenails turn dark or turn blue.  You cough up blood.  You have trouble breathing that gets worse.  You are making loud noises when you breathe (wheezing) and this gets worse.  You have pain that spreads to your neck, arms, or jaw.  You get a rash.  You throw up (vomit).  You pass out (faint). Summary  Pleurisy is irritation and swelling (inflammation) of the linings of your lungs (pleura).  Pleurisy can cause pain and trouble breathing.  If you have a cough that does not get better at home, contact your doctor.  Get help right away if you are having trouble breathing and it is getting worse. This information is not intended to replace advice given to you by your health care provider. Make sure  you discuss any questions you have with your health care provider. Document Revised: 10/04/2017 Document Reviewed: 07/16/2016 Elsevier Patient Education  [place].   Nonspecific Chest Pain Chest pain can be caused by many different conditions. Some causes of chest pain can be life-threatening. These will require treatment right away. Serious causes of chest pain include:  Heart attack.  A tear in the body's main blood vessel.  Redness and swelling (inflammation) around your heart.  Blood clot in your lungs. Other causes of chest pain may not be so serious. These include:  Heartburn.  Anxiety or stress.  Damage to bones or muscles in your chest.  Lung infections. Chest pain can feel like:  Pain or discomfort in your chest.  Crushing, pressure, aching, or squeezing pain.  Burning or tingling.  Dull or sharp pain that is worse when you move,  cough, or take a deep breath.  Pain or discomfort that is also felt in your back, neck, jaw, shoulder, or arm, or pain that spreads to any of these areas. It is hard to know whether your pain is caused by something that is serious or something that is not so serious. So it is important to see your doctor right away if you have chest pain. Follow these instructions at home: Medicines  Take over-the-counter and prescription medicines only as told by your doctor.  If you were prescribed an antibiotic medicine, take it as told by your doctor. Do not stop taking the antibiotic even if you start to feel better. Lifestyle   Rest as told by your doctor.  Do not use any products that contain nicotine or tobacco, such as cigarettes, e-cigarettes, and chewing tobacco. If you need help quitting, ask your doctor.  Do not drink alcohol.  Make lifestyle changes as told by your doctor. These may include: ? Getting regular exercise. Ask your doctor what activities are safe for you. ? Eating a heart-healthy diet. A diet and nutrition specialist (dietitian) can help you to learn healthy eating options. ? Staying at a healthy weight. ? Treating diabetes or high blood pressure, if needed. ? Lowering your stress. Activities such as yoga and relaxation techniques can help. General instructions  Pay attention to any changes in your symptoms. Tell your doctor about them or any new symptoms.  Avoid any activities that cause chest pain.  Keep all follow-up visits as told by your doctor. This is important. You may need more testing if your chest pain does not go away. Contact a doctor if:  Your chest pain does not go away.  You feel depressed.  You have a fever. Get help right away if:  Your chest pain is worse.  You have a cough that gets worse, or you cough up blood.  You have very bad (severe) pain in your belly (abdomen).  You pass out (faint).  You have either of these for no clear  reason: ? Sudden chest discomfort. ? Sudden discomfort in your arms, back, neck, or jaw.  You have shortness of breath at any time.  You suddenly start to sweat, or your skin gets clammy.  You feel sick to your stomach (nauseous).  You throw up (vomit).  You suddenly feel lightheaded or dizzy.  You feel very weak or tired.  Your heart starts to beat fast, or it feels like it is skipping beats. These symptoms may be an emergency. Do not wait to see if the symptoms will go away. Get medical help right away. Call  your local emergency services (911 in the [place] Do not drive yourself to the hospital. Summary  Chest pain can be caused by many different conditions. The cause may be serious and need treatment right away. If you have chest pain, see your doctor right away.  Follow your doctor's instructions for taking medicines and making lifestyle changes.  Keep all follow-up visits as told by your doctor. This includes visits for any further testing if your chest pain does not go away.  Be sure to know the signs that show that your condition has become worse. Get help right away if you have these symptoms. This information is not intended to replace advice given to you by your health care provider. Make sure you discuss any questions you have with your health care provider. Document Revised: 04/24/2018 Document Reviewed: 04/24/2018 Elsevier Patient Education  2020 [employer].

## 2020-11-14 ENCOUNTER — Ambulatory Visit: Payer: Self-pay | Admitting: *Deleted

## 2020-11-14 NOTE — Telephone Encounter (Signed)
Upper abdominal cramping for 3 days. Intermittent cramping with eating and certain movements and pain is felt is the lower back with some of the episodes. Resolves after several minutes, passing gas and burping. Last BM yesterday appeared very dark almost black. No fever. No difficulty voiding. Advised clear liquids only today, try mylanta/maalox. Increase water intake. With any pain that lasts more than 1 hour seek immediate evaluation. Appointment scheduled by office personnel. Cleared vis DT. Appointment tomorrow with Roxan [employer]el   Reason for Disposition . [1] MILD pain (e.g., does not interfere with normal activities) AND [2] comes and goes (cramps) AND [3] present > 72 hours  (Exception: this same abdominal pain is a chronic symptom recurrent or ongoing AND present > 4 weeks)  Answer Assessment - Initial Assessment Questions 1. LOCATION: "Where does it hurt?"     Above the umbilicus, with cramps 2. RADIATION: "Does the pain shoot anywhere else?" (e.g., chest, back)     The cramping goes to the lower back sometimes. 3. ONSET: "When did the pain begin?" (e.g., minutes, hours or days ago)      Friday 4. SUDDEN: "Gradual or sudden onset?"     gradual 5. PATTERN "Does the pain come and go, or is it constant?"    - If constant: "Is it getting better, staying the same, or worsening?"      (Note: Constant means the pain never goes away completely; most serious pain is constant and it progresses)     - If intermittent: "How long does it last?" "Do you have pain now?"     (Note: Intermittent means the pain goes away completely between bouts)     intermittent  6. SEVERITY: "How bad is the pain?"  (e.g., Scale 1-10; mild, moderate, or severe)    - MILD (1-3): doesn't interfere with normal activities, abdomen soft and not tender to touch     - MODERATE (4-7): interferes with normal activities or awakens from sleep, tender to touch     - SEVERE (8-10): excruciating pain, doubled over, unable to do any  normal activities       10 out of 10 7. RECURRENT SYMPTOM: "Have you ever had this type of stomach pain before?" If Yes, ask: "When was the last time?" and "What happened that time?"      no 8. AGGRAVATING FACTORS: "Does anything seem to cause this pain?" (e.g., foods, stress, alcohol)     No idea 9. CARDIAC SYMPTOMS: "Do you have any of the following symptoms: chest pain, difficulty breathing, sweating, nausea?"     sweating 10. OTHER SYMPTOMS: "Do you have any other symptoms?" (e.g., fever, vomiting, diarrhea)       Diarrhea Saturday morning 11. PREGNANCY: "Is there any chance you are pregnant?" "When was your last menstrual period?"      na  Protocols used: ABDOMINAL PAIN - UPPER-A-AH

## 2020-11-15 ENCOUNTER — Other Ambulatory Visit: Payer: Self-pay

## 2020-11-15 ENCOUNTER — Ambulatory Visit: Payer: 59 | Admitting: Family Medicine

## 2020-11-15 ENCOUNTER — Encounter: Payer: Self-pay | Admitting: Family Medicine

## 2020-11-15 VITALS — BP 122/70 | HR 92 | Temp 98.2°F | Resp 18 | Ht 70.0 in | Wt 215.6 lb

## 2020-11-15 DIAGNOSIS — R1084 Generalized abdominal pain: Secondary | ICD-10-CM

## 2020-11-15 DIAGNOSIS — Z114 Encounter for screening for human immunodeficiency virus [HIV]: Secondary | ICD-10-CM

## 2020-11-15 DIAGNOSIS — K219 Gastro-esophageal reflux disease without esophagitis: Secondary | ICD-10-CM | POA: Diagnosis not present

## 2020-11-15 DIAGNOSIS — R1013 Epigastric pain: Secondary | ICD-10-CM | POA: Diagnosis not present

## 2020-11-15 DIAGNOSIS — Z720 Tobacco use: Secondary | ICD-10-CM

## 2020-11-15 DIAGNOSIS — Z1159 Encounter for screening for other viral diseases: Secondary | ICD-10-CM | POA: Diagnosis not present

## 2020-11-15 LAB — POCT URINALYSIS DIPSTICK
Bilirubin, UA: NEGATIVE
Blood, UA: NEGATIVE
Glucose, UA: NEGATIVE
Ketones, UA: NEGATIVE
Leukocytes, UA: NEGATIVE
Nitrite, UA: NEGATIVE
Protein, UA: POSITIVE — AB
Spec Grav, UA: 1.02 (ref 1.010–1.025)
Urobilinogen, UA: 0.2 E.U./dL
pH, UA: 6 (ref 5.0–8.0)

## 2020-11-15 LAB — COMPLETE METABOLIC PANEL WITH GFR
ALT: 42 U/L (ref 9–46)
Creat: 0.84 mg/dL (ref 0.60–1.35)
GFR, Est Non African American: 117 mL/min/{1.73_m2} (ref >=60)
Globulin: 2.9 g/dL (calc) (ref 1.9–3.7)
Potassium: 4.6 mmol/L (ref 3.5–5.3)
Sodium: 139 mmol/L (ref 135–146)

## 2020-11-15 LAB — CBC WITH DIFFERENTIAL/PLATELET
Basophils Relative: 0.7 %
HCT: 50.1 % — ABNORMAL HIGH (ref 38.5–50.0)
Lymphs Abs: 1532 cells/uL (ref 850–3900)
MCV: 89.8 fL (ref 80.0–100.0)
RBC: 5.58 10*6/uL (ref 4.20–5.80)

## 2020-11-15 MED ORDER — OMEPRAZOLE 40 MG PO CPDR: 40.0000 mg | DELAYED_RELEASE_CAPSULE | Freq: Every day | ORAL | 3 refills | Status: DC

## 2020-11-15 NOTE — Progress Notes (Signed)
Patient ID: Timothy Padilla, male    DOB: 06-26-1991, 30 y.o.   MRN: [id number]  PCP: Danelle Berry, PA-C  Chief Complaint  Patient presents with  . Abdominal Pain    Cramping for 5 days    Subjective:   Timothy Padilla is a 30 y.o. male, presents to clinic with CC of the following:  HPI  Patient presents for gradual onset of epigastric abdominal pain began about 5 days ago, intermittent coming and going feels like a cramp sometimes squeezing with waves of intensity of pain, seems to radiate around his upper abdomen and sometimes to his back, worse with eating.  Severe enough that it is woken him up from sleep a few times in the past couple days.  He is eating less and has somewhat decreased appetite but he denies any nausea vomiting or diarrhea.  He feels some bloating and indigestion, tried some Gas-X and it did not help, he has a history of GERD but he has not been on any PPI or H2 blockers He denies any bloating, fever, chills, sweats, rash, no sick contacts No history of abdominal surgeries Last bowel movement was this morning it was normal He had one episode of pain which didn't seem to be relieved after he urinated but he denies any dysuria hematuria or flank pain   Patient Active Problem List   Diagnosis Date Noted  . Gastroesophageal reflux disease 04/28/2020  . Other viral warts 04/12/2020  . Tobacco abuse 01/11/2018  . Abnormal x-ray of thoracic spine 01/11/2018  . Elevated blood pressure reading 03/27/2016      Current Outpatient Medications:  .  predniSONE (DELTASONE) 20 MG tablet, 2 tabs poqday 1-3, 1 tabs poqday 4-6 (Patient not taking: Reported on 11/15/2020), Disp: 9 tablet, Rfl: 0   Allergies  Allergen Reactions  . Wellbutrin [Bupropion]     Tremor,fatigue,mood changes     Social History   Tobacco Use  . Smoking status: Current Every Day Smoker    Packs/day: 1.50    Years: 14.00    Pack years: 21.00    Types: Cigarettes    Start date: 01/07/2004  .  Smokeless tobacco: Former Neurosurgeon    Types: Snuff  . Tobacco comment: just tried smokeless 2 times, does not currently use  Vaping Use  . Vaping Use: Never used  Substance Use Topics  . Alcohol use: Yes    Alcohol/week: 2.0 - 3.0 standard drinks    Types: 2 - 3 Cans of beer per week    Comment: per day  . Drug use: No      Chart Review Today: I personally reviewed active problem list, medication list, allergies, family history, social history, health maintenance, notes from last encounter, lab results, imaging with the patient/caregiver today.   Review of Systems All other systems reviewed are negative for acute change except as noted in the HPI.     Objective:   Vitals:   11/15/20 0950  BP: 122/70  Pulse: 92  Resp: 18  Temp: 98.2 F (36.8 C)  TempSrc: Oral  SpO2: 99%  Weight: 215 lb 9.6 oz (97.8 kg)  Height: 5\' 10"  (1.778 m)    Body mass index is 30.94 kg/m.  Physical Exam Vitals and nursing note reviewed.  Constitutional:      General: He is not in acute distress.    Appearance: Normal appearance. He is well-developed and overweight. He is not ill-appearing, toxic-appearing or diaphoretic.     Interventions: Face mask in  place.  HENT:     Head: Normocephalic and atraumatic.     Jaw: No trismus.     Right Ear: External ear normal.     Left Ear: External ear normal.  Eyes:     General: Lids are normal. No scleral icterus.       Right eye: No discharge.        Left eye: No discharge.     Conjunctiva/sclera: Conjunctivae normal.     Pupils: Pupils are equal, round, and reactive to light.  Neck:     Trachea: Trachea and phonation normal. No tracheal deviation.  Cardiovascular:     Rate and Rhythm: Normal rate and regular rhythm.     Pulses: Normal pulses.          Radial pulses are 2+ on the right side and 2+ on the left side.       Posterior tibial pulses are 2+ on the right side and 2+ on the left side.     Heart sounds: Normal heart sounds. No murmur  heard. No friction rub. No gallop.   Pulmonary:     Effort: Pulmonary effort is normal. No respiratory distress.     Breath sounds: Normal breath sounds. No stridor. No wheezing, rhonchi or rales.  Abdominal:     General: Bowel sounds are normal. There is no distension.     Palpations: Abdomen is soft.     Tenderness: There is no abdominal tenderness. There is no right CVA tenderness, left CVA tenderness, guarding or rebound. Negative signs include Murphy's sign, Rovsing's sign and McBurney's sign.  Musculoskeletal:     Right lower leg: No edema.     Left lower leg: No edema.  Skin:    General: Skin is warm and dry.     Capillary Refill: Capillary refill takes less than 2 seconds.     Coloration: Skin is not jaundiced.     Findings: No rash.     Nails: There is no clubbing.  Neurological:     Mental Status: He is alert. Mental status is at baseline.     Cranial Nerves: No dysarthria or facial asymmetry.     Motor: No tremor or abnormal muscle tone.     Gait: Gait normal.  Psychiatric:        Mood and Affect: Mood normal.        Speech: Speech normal.        Behavior: Behavior normal. Behavior is cooperative.      Results for orders placed or performed in visit on 04/28/20  COMPLETE METABOLIC PANEL WITH GFR  Result Value Ref Range   Glucose, Bld 88 65 - 99 mg/dL   BUN 14 7 - 25 mg/dL   Creat 4.31 5.40 - 0.86 mg/dL   GFR, Est Non African American 123 > OR = 60 mL/min/1.23m2   GFR, Est African American 142 > OR = 60 mL/min/1.80m2   BUN/Creatinine Ratio NOT APPLICABLE 6 - 22 (calc)   Sodium 140 135 - 146 mmol/L   Potassium 4.3 3.5 - 5.3 mmol/L   Chloride 105 98 - 110 mmol/L   CO2 29 20 - 32 mmol/L   Calcium 9.8 8.6 - 10.3 mg/dL   Total Protein 7.5 6.1 - 8.1 g/dL   Albumin 4.6 3.6 - 5.1 g/dL   Globulin 2.9 1.9 - 3.7 g/dL (calc)   AG Ratio 1.6 1.0 - 2.5 (calc)   Total Bilirubin 1.1 0.2 - 1.2 mg/dL   Alkaline phosphatase (APISO) 64  36 - 130 U/L   AST 15 10 - 40 U/L   ALT  18 9 - 46 U/L  Lipid panel  Result Value Ref Range   Cholesterol 155 <200 mg/dL   HDL 48 > OR = 40 mg/dL   Triglycerides 58 <672 mg/dL   LDL Cholesterol (Calc) 93 mg/dL (calc)   Total CHOL/HDL Ratio 3.2 <5.0 (calc)   Non-HDL Cholesterol (Calc) 107 <130 mg/dL (calc)       Assessment & Plan:     ICD-10-CM   1. Epigastric abdominal pain  R10.13 POCT urinalysis dipstick    CBC with Differential/Platelet    COMPLETE METABOLIC PANEL WITH GFR    Lipase    H. pylori breath test    US ABDOMEN LIMITED RUQ (LIVER/GB)    omeprazole (PRILOSEC) 40 MG capsule   No tenderness on exam, history suspicious for possible cholelithiasis, may also be GERD gastritis peptic ulcer?    2. Colicky abdominal pain  R10.84 POCT urinalysis dipstick    CBC with Differential/Platelet    COMPLETE METABOLIC PANEL WITH GFR    Lipase    H. pylori breath test    US ABDOMEN LIMITED RUQ (LIVER/GB)   Colicky nature, worse with eating and with eating certain types of food -may be cholelithiasis, abdominal exam benign doubt cholecystitis  3. Gastroesophageal reflux disease, unspecified whether esophagitis present  K21.9 omeprazole (PRILOSEC) 40 MG capsule   Some of the symptoms consistent with GERD he is not on any medicines, discussed GERD diet avoid fatty fried foods start PPI  4. Screening for HIV without presence of risk factors  Z11.4 HIV Antibody (routine testing w rflx)  5. Encounter for hepatitis C screening test for low risk patient  Z11.59 Hepatitis C antibody  6. Tobacco abuse  Z72.0    Encouraged to stop smoking   H. pylori testing done today prior to starting PPI -doing this because of recurrent office visits with symptoms that can be from GERD/H. Pylori/ulcers etc.  Doing basic labs to rule out pancreatitis, check white count electrolytes liver function test  Right upper quadrant ultrasound to evaluate for gallstones-negative for Murphy sign on exam  His bowel movements have been normal it does not seem  infectious in nature  Encourage patient to do Prilosec treatment can try Mylanta or Maalox, simethicone medications over-the-counter, push fluids, and avoid fatty fried foods, dairy, spicy foods  Follow-up in the next 1 to 2 weeks  If right upper quadrant ultrasound if positive will refer to surgery -if negative will treat for GERD and patient can follow-up with GI if symptoms do not improve in a few months  Results for orders placed or performed in visit on 11/15/20  POCT urinalysis dipstick  Result Value Ref Range   Color, UA yellow    Clarity, UA clear    Glucose, UA Negative Negative   Bilirubin, UA negative    Ketones, UA negative    Spec Grav, UA 1.020 1.010 - 1.025   Blood, UA negative    pH, UA 6.0 5.0 - 8.0   Protein, UA Positive (A) Negative   Urobilinogen, UA 0.2 0.2 or 1.0 E.U./dL   Nitrite, UA negative    Leukocytes, UA Negative Negative   Appearance clear    Odor none   Ua neg- doubt UTI or nephrolithiasis   Danelle Berry, PA-C 11/15/20 10:11 AM

## 2020-11-15 NOTE — Patient Instructions (Signed)
Start omeprazole daily in the morning on empty stomach Eat bland diet, push clear fluids, avoid fatty fried foods, spicy foods and/or dairy  We will call you with labs and you should hear about getting your ultrasound scheduled in the next week.  If you have any worsening abdominal pain and inability to keep down food/fluids/meds - please follow up right away or get checked by urgent care.  I want to see if you may have any gallstones, symptoms also could be upper GI/GERD or peptic or gastric ulcers - start treatment with omeprazole which was sent in    Abdominal Pain, Adult Many things can cause belly (abdominal) pain. Most times, belly pain is not dangerous. Many cases of belly pain can be watched and treated at home. Sometimes, though, belly pain is serious. Your doctor will try to find the cause of your belly pain. Follow these instructions at home: Medicines  Take over-the-counter and prescription medicines only as told by your doctor.  Do not take medicines that help you poop (laxatives) unless told by your doctor. General instructions  Watch your belly pain for any changes.  Drink enough fluid to keep your pee (urine) pale yellow.  Keep all follow-up visits as told by your doctor. This is important.   Contact a doctor if:  Your belly pain changes or gets worse.  You are not hungry, or you lose weight without trying.  You are having trouble pooping (constipated) or have watery poop (diarrhea) for more than 2-3 days.  You have pain when you pee or poop.  Your belly pain wakes you up at night.  Your pain gets worse with meals, after eating, or with certain foods.  You are vomiting and cannot keep anything down.  You have a fever.  You have blood in your pee. Get help right away if:  Your pain does not go away as soon as your doctor says it should.  You cannot stop vomiting.  Your pain is only in areas of your belly, such as the right side or the left lower part  of the belly.  You have bloody or black poop, or poop that looks like tar.  You have very bad pain, cramping, or bloating in your belly.  You have signs of not having enough fluid or water in your body (dehydration), such as: ? Dark pee, very little pee, or no pee. ? Cracked lips. ? Dry mouth. ? Sunken eyes. ? Sleepiness. ? Weakness.  You have trouble breathing or chest pain. Summary  Many cases of belly pain can be watched and treated at home.  Watch your belly pain for any changes.  Take over-the-counter and prescription medicines only as told by your doctor.  Contact a doctor if your belly pain changes or gets worse.  Get help right away if you have very bad pain, cramping, or bloating in your belly. This information is not intended to replace advice given to you by your health care provider. Make sure you discuss any questions you have with your health care provider. Document Revised: 03/02/2019 Document Reviewed: 03/02/2019 Elsevier Patient Education  2021 [employer]   Gastroesophageal Reflux Disease, Adult  Gastroesophageal reflux (GER) happens when acid from the stomach flows up into the tube that connects the mouth and the stomach (esophagus). Normally, food travels down the esophagus and stays in the stomach to be digested. With GER, food and stomach acid sometimes move back up into the esophagus. You may have a disease called gastroesophageal  reflux disease (GERD) if the reflux:  Happens often.  Causes frequent or very bad symptoms.  Causes problems such as damage to the esophagus. When this happens, the esophagus becomes sore and swollen. Over time, GERD can make small holes (ulcers) in the lining of the esophagus. What are the causes? This condition is caused by a problem with the muscle between the esophagus and the stomach. When this muscle is weak or not normal, it does not close properly to keep food and acid from coming back up from the stomach. The  muscle can be weak because of:  Tobacco use.  Pregnancy.  Having a certain type of hernia (hiatal hernia).  Alcohol use.  Certain foods and drinks, such as coffee, chocolate, onions, and peppermint. What increases the risk?  Being overweight.  Having a disease that affects your connective tissue.  Taking NSAIDs, such a ibuprofen. What are the signs or symptoms?  Heartburn.  Difficult or painful swallowing.  The feeling of having a lump in the throat.  A bitter taste in the mouth.  Bad breath.  Having a lot of saliva.  Having an upset or bloated stomach.  Burping.  Chest pain. Different conditions can cause chest pain. Make sure you see your doctor if you have chest pain.  Shortness of breath or wheezing.  A long-term cough or a cough at night.  Wearing away of the surface of teeth (tooth enamel).  Weight loss. How is this treated?  Making changes to your diet.  Taking medicine.  Having surgery. Treatment will depend on how bad your symptoms are. Follow these instructions at home: Eating and drinking  Follow a diet as told by your doctor. You may need to avoid foods and drinks such as: ? Coffee and tea, with or without caffeine. ? Drinks that contain alcohol. ? Energy drinks and sports drinks. ? Bubbly (carbonated) drinks or sodas. ? Chocolate and cocoa. ? Peppermint and mint flavorings. ? Garlic and onions. ? Horseradish. ? Spicy and acidic foods. These include peppers, chili powder, curry powder, vinegar, hot sauces, and BBQ sauce. ? Citrus fruit juices and citrus fruits, such as oranges, lemons, and limes. ? Tomato-based foods. These include red sauce, chili, salsa, and pizza with red sauce. ? Fried and fatty foods. These include donuts, french fries, potato chips, and high-fat dressings. ? High-fat meats. These include hot dogs, rib eye steak, sausage, ham, and bacon. ? High-fat dairy items, such as whole milk, butter, and cream cheese.  Eat  small meals often. Avoid eating large meals.  Avoid drinking large amounts of liquid with your meals.  Avoid eating meals during the 2-3 hours before bedtime.  Avoid lying down right after you eat.  Do not exercise right after you eat.   Lifestyle  Do not smoke or use any products that contain nicotine or tobacco. If you need help quitting, ask your doctor.  Try to lower your stress. If you need help doing this, ask your doctor.  If you are overweight, lose an amount of weight that is healthy for you. Ask your doctor about a safe weight loss goal.   General instructions  Pay attention to any changes in your symptoms.  Take over-the-counter and prescription medicines only as told by your doctor.  Do not take aspirin, ibuprofen, or other NSAIDs unless your doctor says it is okay.  Wear loose clothes. Do not wear anything tight around your waist.  Raise (elevate) the head of your bed about 6 inches (15  cm). You may need to use a wedge to do this.  Avoid bending over if this makes your symptoms worse.  Keep all follow-up visits. Contact a doctor if:  You have new symptoms.  You lose weight and you do not know why.  You have trouble swallowing or it hurts to swallow.  You have wheezing or a cough that keeps happening.  You have a hoarse voice.  Your symptoms do not get better with treatment. Get help right away if:  You have sudden pain in your arms, neck, jaw, teeth, or back.  You suddenly feel sweaty, dizzy, or light-headed.  You have chest pain or shortness of breath.  You vomit and the vomit is green, yellow, or black, or it looks like blood or coffee grounds.  You faint.  Your poop (stool) is red, bloody, or black.  You cannot swallow, drink, or eat. These symptoms may represent a serious problem that is an emergency. Do not wait to see if the symptoms will go away. Get medical help right away. Call your local emergency services (911 in the [place] Do not  drive yourself to the hospital. Summary  If a person has gastroesophageal reflux disease (GERD), food and stomach acid move back up into the esophagus and cause symptoms or problems such as damage to the esophagus.  Treatment will depend on how bad your symptoms are.  Follow a diet as told by your doctor.  Take all medicines only as told by your doctor. This information is not intended to replace advice given to you by your health care provider. Make sure you discuss any questions you have with your health care provider. Document Revised: 05/02/2020 Document Reviewed: 05/02/2020 Elsevier Patient Education  2021 [employer].

## 2020-11-16 LAB — COMPLETE METABOLIC PANEL WITH GFR
AG Ratio: 1.7 (calc) (ref 1.0–2.5)
AST: 26 U/L (ref 10–40)
Albumin: 4.8 g/dL (ref 3.6–5.1)
Alkaline phosphatase (APISO): 65 U/L (ref 36–130)
BUN: 13 mg/dL (ref 7–25)
CO2: 28 mmol/L (ref 20–32)
Calcium: 9.7 mg/dL (ref 8.6–10.3)
Chloride: 104 mmol/L (ref 98–110)
GFR, Est African American: 136 mL/min/{1.73_m2} (ref >=60)
Glucose, Bld: 89 mg/dL (ref 65–99)
Total Bilirubin: 1.3 mg/dL — ABNORMAL HIGH (ref 0.2–1.2)
Total Protein: 7.7 g/dL (ref 6.1–8.1)

## 2020-11-16 LAB — CBC WITH DIFFERENTIAL/PLATELET
Absolute Monocytes: 313 cells/uL (ref 200–950)
Basophils Absolute: 32 cells/uL (ref 0–200)
Eosinophils Absolute: 69 cells/uL (ref 15–500)
Eosinophils Relative: 1.5 %
Hemoglobin: 17.6 g/dL — ABNORMAL HIGH (ref 13.2–17.1)
MCH: 31.5 pg (ref 27.0–33.0)
MCHC: 35.1 g/dL (ref 32.0–36.0)
MPV: 10.8 fL (ref 7.5–12.5)
Monocytes Relative: 6.8 %
Neutro Abs: 2654 cells/uL (ref 1500–7800)
Neutrophils Relative %: 57.7 %
Platelets: 181 10*3/uL (ref 140–400)
RDW: 11.9 % (ref 11.0–15.0)
Total Lymphocyte: 33.3 %
WBC: 4.6 10*3/uL (ref 3.8–10.8)

## 2020-11-16 LAB — H. PYLORI BREATH TEST: H. pylori Breath Test: NOT DETECTED

## 2020-11-16 LAB — HEPATITIS C ANTIBODY
Hepatitis C Ab: NONREACTIVE
SIGNAL TO CUT-OFF: 0.02 (ref <1.00)

## 2020-11-16 LAB — HIV ANTIBODY (ROUTINE TESTING W REFLEX): HIV 1&2 Ab, 4th Generation: NONREACTIVE

## 2020-11-16 LAB — LIPASE: Lipase: 19 U/L (ref 7–60)

## 2020-11-23 ENCOUNTER — Ambulatory Visit: Payer: BC Managed Care – PPO

## 2021-05-01 ENCOUNTER — Encounter: Payer: 59 | Admitting: Family Medicine

## 2021-05-11 ENCOUNTER — Encounter: Payer: BC Managed Care – PPO | Admitting: Unknown Physician Specialty

## 2021-07-16 IMAGING — CR DG CHEST 2V
1 series · 2 of 2 positions shown · non-contrast
Comparison: None.

CLINICAL DATA: Intermittent chest pain

EXAM:
CHEST - 2 VIEW

[Series 1: dg chest 2 view · 0.14mm/px · 2 of 2 slices shown]
[im 1/2]
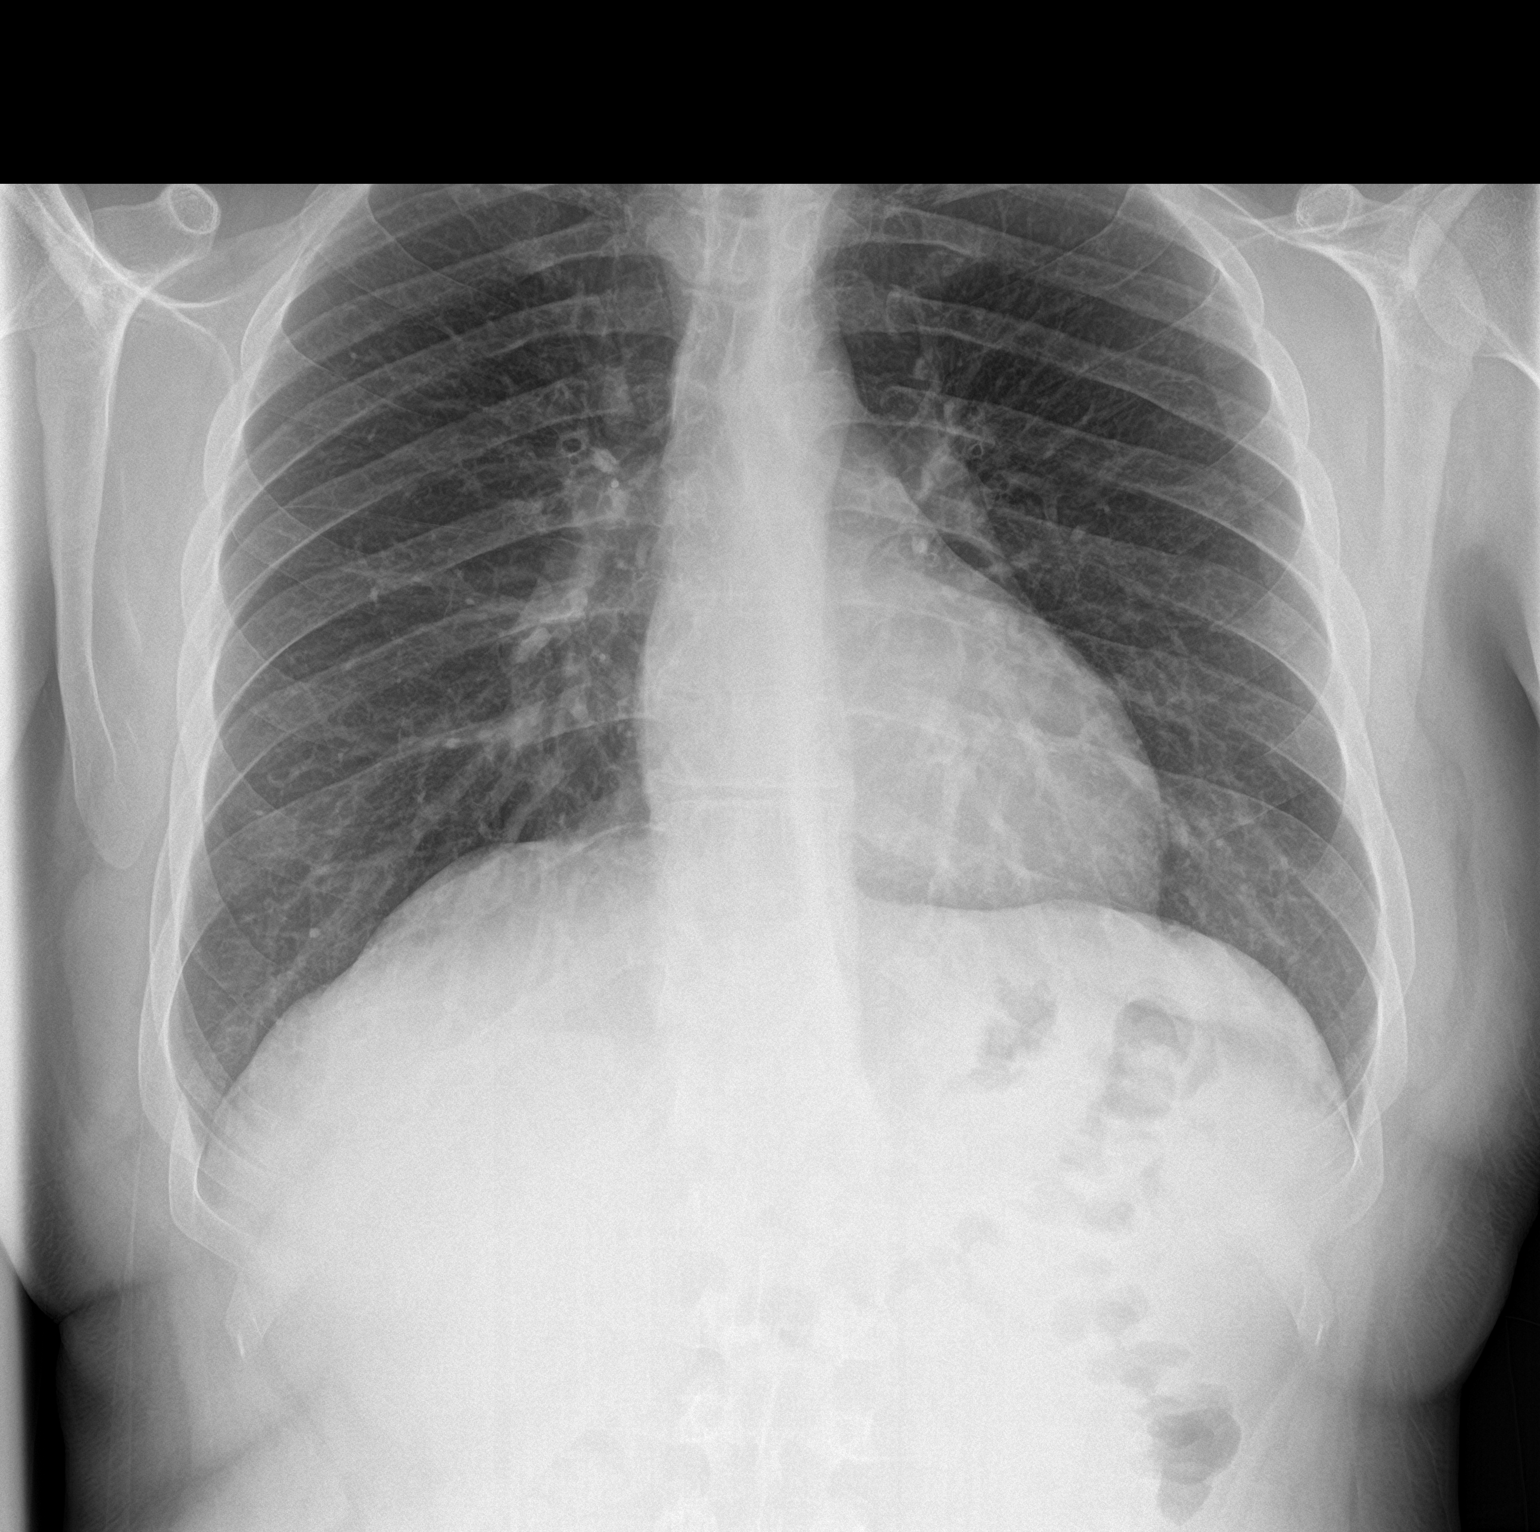
[im 2/2]
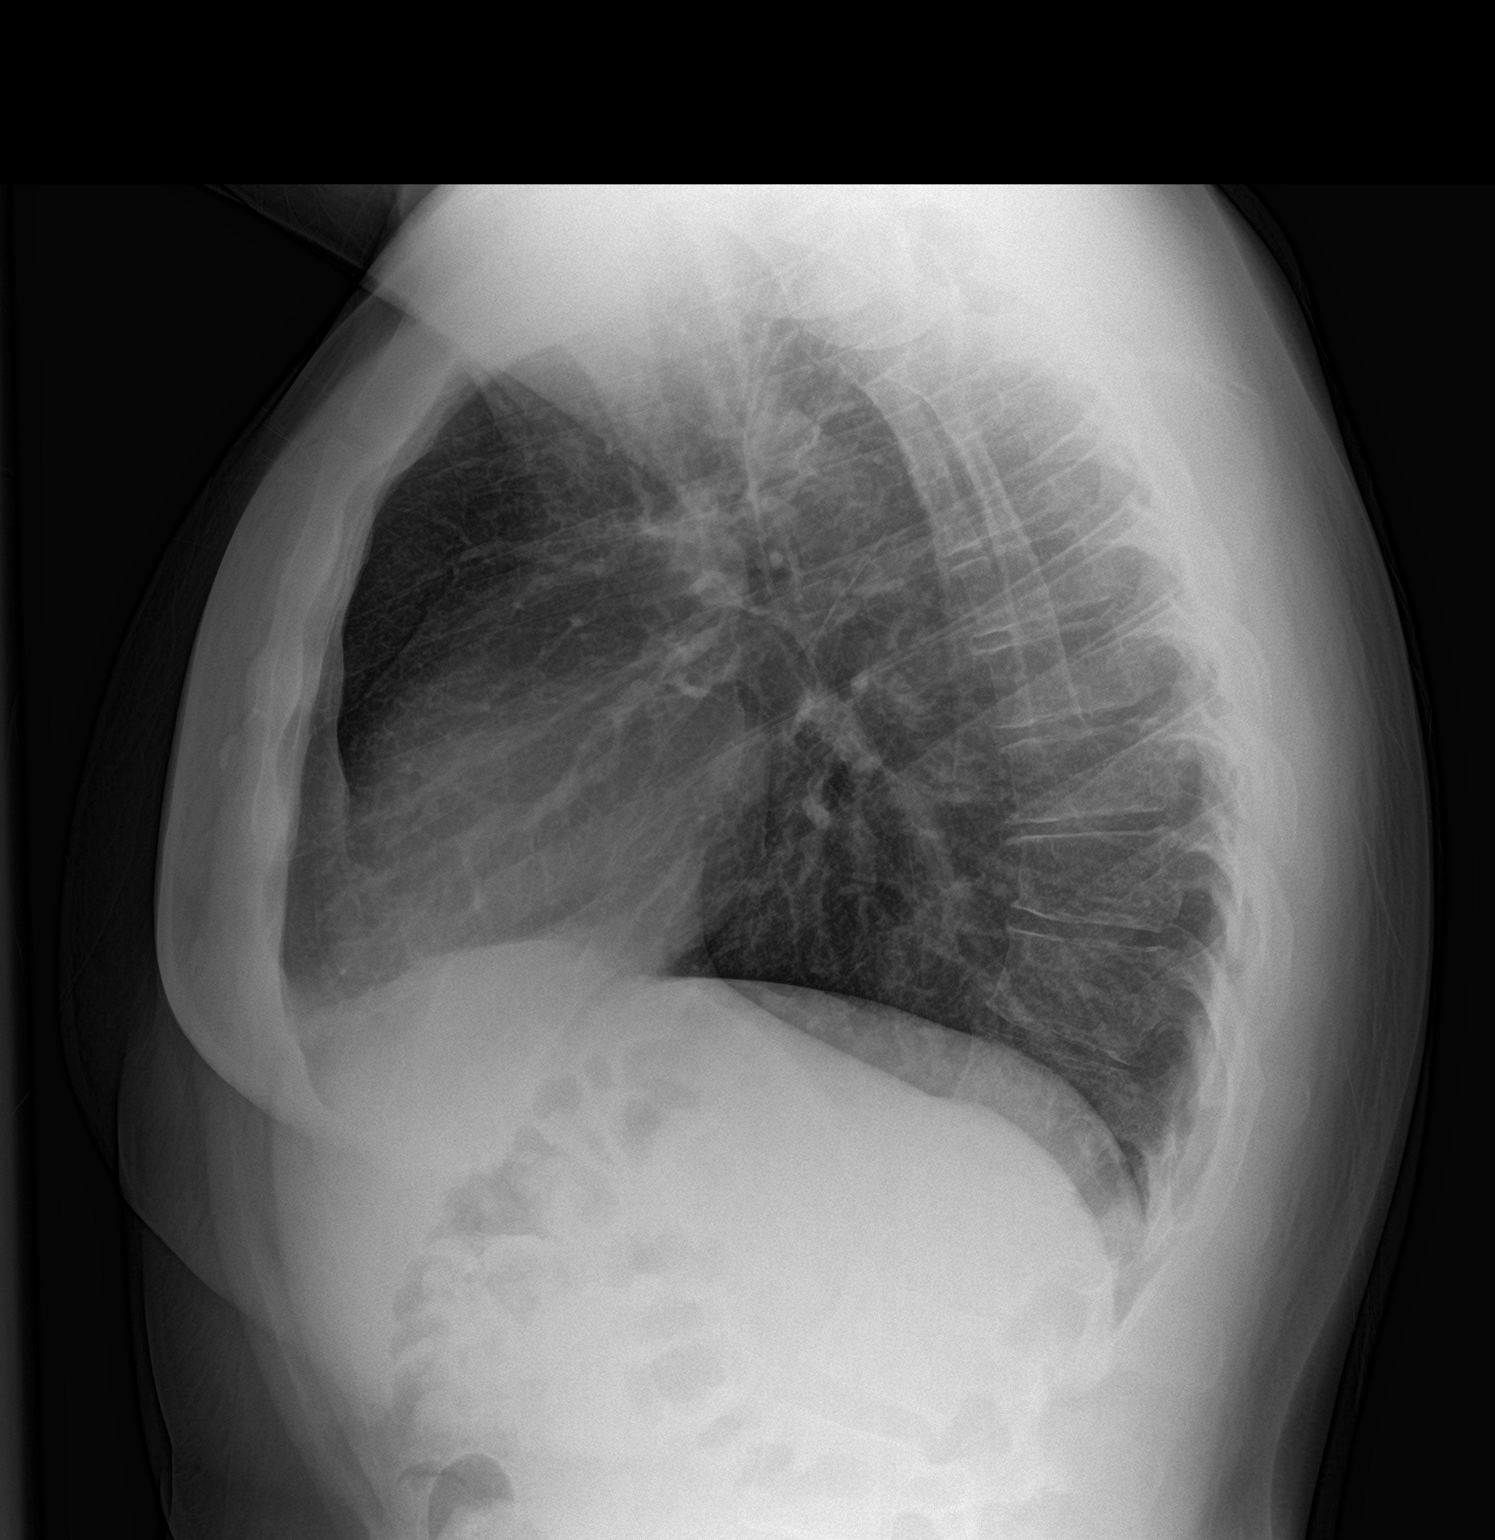

[2 of 2 positions shown; findings below may reference images not displayed]

FINDINGS: The heart size and mediastinal contours are within normal limits.
Both lungs are clear. The visualized skeletal structures are
unremarkable.
IMPRESSION: No active cardiopulmonary disease.

## 2021-09-07 ENCOUNTER — Other Ambulatory Visit: Payer: Self-pay | Admitting: Family Medicine

## 2021-09-07 DIAGNOSIS — K219 Gastro-esophageal reflux disease without esophagitis: Secondary | ICD-10-CM

## 2021-09-07 DIAGNOSIS — R1013 Epigastric pain: Secondary | ICD-10-CM

## 2021-11-27 ENCOUNTER — Other Ambulatory Visit: Payer: Self-pay | Admitting: Family Medicine

## 2021-11-27 DIAGNOSIS — R1013 Epigastric pain: Secondary | ICD-10-CM

## 2021-11-27 DIAGNOSIS — K219 Gastro-esophageal reflux disease without esophagitis: Secondary | ICD-10-CM

## 2022-01-05 ENCOUNTER — Ambulatory Visit: Payer: 59 | Admitting: Nurse Practitioner

## 2022-01-05 ENCOUNTER — Encounter: Payer: Self-pay | Admitting: Nurse Practitioner

## 2022-01-05 ENCOUNTER — Other Ambulatory Visit: Payer: Self-pay

## 2022-01-05 VITALS — BP 118/78 | HR 96 | Temp 98.3°F | Resp 18 | Ht 70.0 in | Wt 199.9 lb

## 2022-01-05 DIAGNOSIS — Z131 Encounter for screening for diabetes mellitus: Secondary | ICD-10-CM | POA: Diagnosis not present

## 2022-01-05 DIAGNOSIS — Z13220 Encounter for screening for lipoid disorders: Secondary | ICD-10-CM

## 2022-01-05 DIAGNOSIS — K219 Gastro-esophageal reflux disease without esophagitis: Secondary | ICD-10-CM

## 2022-01-05 DIAGNOSIS — Z130 Encounter for screening for diseases of the blood and blood-forming organs and certain disorders involving the immune mechanism: Secondary | ICD-10-CM

## 2022-01-05 MED ORDER — OMEPRAZOLE 40 MG PO CPDR: 40.0000 mg | DELAYED_RELEASE_CAPSULE | Freq: Every day | ORAL | 3 refills | Status: DC

## 2022-01-05 NOTE — Progress Notes (Signed)
? ?BP 118/78   Pulse 96   Temp 98.3 ?F (36.8 ?C) (Oral)   Resp 18   Ht 5\' 10"  (1.778 m)   Wt 199 lb 14.4 oz (90.7 kg)   SpO2 98%   BMI 28.68 kg/m?   ? ?Subjective:  ? ? Patient ID: Timothy Padilla, male    DOB: 09-20-1991, 31 y.o.   MRN: [id number] ? ?HPI: ?Timothy Padilla is a 31 y.o. male ? ?Chief Complaint  ?Patient presents with  ? Gastroesophageal Reflux  ?  Medication refill  ? ?GERD: He says he has been having trouble with acid reflux for years. He say she has been taking omeprazole to help. He says he needs a refill on his medication.  He says he tries to avoid triggers and knows not to lay down after eating.  He says his reflux is well controlled with medication. ? ?He is due for labs, he says he can not do them today but will come back and have them drawn.  ? ?Relevant past medical, surgical, family and social history reviewed and updated as indicated. Interim medical history since our last visit reviewed. ?Allergies and medications reviewed and updated. ? ?Review of Systems ? ?Constitutional: Negative for fever or weight change.  ?Respiratory: Negative for cough and shortness of breath.   ?Cardiovascular: Negative for chest pain or palpitations.  ?Gastrointestinal: Negative for abdominal pain, no bowel changes.  ?Musculoskeletal: Negative for gait problem or joint swelling.  ?Skin: Negative for rash.  ?Neurological: Negative for dizziness or headache.  ?No other specific complaints in a complete review of systems (except as listed in HPI above).  ? ?   ?Objective:  ?  ?BP 118/78   Pulse 96   Temp 98.3 ?F (36.8 ?C) (Oral)   Resp 18   Ht 5\' 10"  (1.778 m)   Wt 199 lb 14.4 oz (90.7 kg)   SpO2 98%   BMI 28.68 kg/m?   ?Wt Readings from Last 3 Encounters:  ?01/05/22 199 lb 14.4 oz (90.7 kg)  ?11/15/20 215 lb 9.6 oz (97.8 kg)  ?04/28/20 204 lb 1.6 oz (92.6 kg)  ?  ?Physical Exam ? ?Constitutional: Patient appears well-developed and well-nourished.  No distress.  ?HEENT: head atraumatic, normocephalic,  pupils equal and reactive to light, neck supple ?Cardiovascular: Normal rate, regular rhythm and normal heart sounds.  No murmur heard. No BLE edema. ?Pulmonary/Chest: Effort normal and breath sounds normal. No respiratory distress. ?Abdominal: Soft.  There is no tenderness. ?Psychiatric: Patient has a normal mood and affect. behavior is normal. Judgment and thought content normal.  ? ?Results for orders placed or performed in visit on 11/15/20  ?CBC with Differential/Platelet  ?Result Value Ref Range  ? WBC 4.6 3.8 - 10.8 Thousand/uL  ? RBC 5.58 4.20 - 5.80 Million/uL  ? Hemoglobin 17.6 (H) 13.2 - 17.1 g/dL  ? HCT 50.1 (H) 38.5 - 50.0 %  ? MCV 89.8 80.0 - 100.0 fL  ? MCH 31.5 27.0 - 33.0 pg  ? MCHC 35.1 32.0 - 36.0 g/dL  ? RDW 11.9 11.0 - 15.0 %  ? Platelets 181 140 - 400 Thousand/uL  ? MPV 10.8 7.5 - 12.5 fL  ? Neutro Abs 2,654 1,500 - 7,800 cells/uL  ? Lymphs Abs 1,532 850 - 3,900 cells/uL  ? Absolute Monocytes 313 200 - 950 cells/uL  ? Eosinophils Absolute 69 15 - 500 cells/uL  ? Basophils Absolute 32 0 - 200 cells/uL  ? Neutrophils Relative % 57.7 %  ? Total Lymphocyte  33.3 %  ? Monocytes Relative 6.8 %  ? Eosinophils Relative 1.5 %  ? Basophils Relative 0.7 %  ?COMPLETE METABOLIC PANEL WITH GFR  ?Result Value Ref Range  ? Glucose, Bld 89 65 - 99 mg/dL  ? BUN 13 7 - 25 mg/dL  ? Creat 0.84 0.60 - 1.35 mg/dL  ? GFR, Est Non African American 117 > OR = 60 mL/min/1.20m2  ? GFR, Est African American 136 > OR = 60 mL/min/1.3m2  ? BUN/Creatinine Ratio NOT APPLICABLE 6 - 22 (calc)  ? Sodium 139 135 - 146 mmol/L  ? Potassium 4.6 3.5 - 5.3 mmol/L  ? Chloride 104 98 - 110 mmol/L  ? CO2 28 20 - 32 mmol/L  ? Calcium 9.7 8.6 - 10.3 mg/dL  ? Total Protein 7.7 6.1 - 8.1 g/dL  ? Albumin 4.8 3.6 - 5.1 g/dL  ? Globulin 2.9 1.9 - 3.7 g/dL (calc)  ? AG Ratio 1.7 1.0 - 2.5 (calc)  ? Total Bilirubin 1.3 (H) 0.2 - 1.2 mg/dL  ? Alkaline phosphatase (APISO) 65 36 - 130 U/L  ? AST 26 10 - 40 U/L  ? ALT 42 9 - 46 U/L  ?Hepatitis C  antibody  ?Result Value Ref Range  ? Hepatitis C Ab NON-REACTIVE NON-REACTI  ? SIGNAL TO CUT-OFF 0.02 <1.00  ?HIV Antibody (routine testing w rflx)  ?Result Value Ref Range  ? HIV 1&2 Ab, 4th Generation NON-REACTIVE NON-REACTI  ?Lipase  ?Result Value Ref Range  ? Lipase 19 7 - 60 U/L  ?H. pylori breath test  ?Result Value Ref Range  ? H. pylori Breath Test NOT DETECTED NOT DETECT  ?POCT urinalysis dipstick  ?Result Value Ref Range  ? Color, UA yellow   ? Clarity, UA clear   ? Glucose, UA Negative Negative  ? Bilirubin, UA negative   ? Ketones, UA negative   ? Spec Grav, UA 1.020 1.010 - 1.025  ? Blood, UA negative   ? pH, UA 6.0 5.0 - 8.0  ? Protein, UA Positive (A) Negative  ? Urobilinogen, UA 0.2 0.2 or 1.0 E.U./dL  ? Nitrite, UA negative   ? Leukocytes, UA Negative Negative  ? Appearance clear   ? Odor none   ? ?   ?Assessment & Plan:  ? ?1. Gastroesophageal reflux disease, unspecified whether esophagitis present ?-avoid triggers, do not lay down for at least one hour after eating ?- omeprazole (PRILOSEC) 40 MG capsule; Take 1 capsule (40 mg total) by mouth daily.  Dispense: 90 capsule; Refill: 3 ?- CBC with Differential/Platelet ? ?2. Screening for diabetes mellitus ? ?- COMPLETE METABOLIC PANEL WITH GFR ? ?3. Screening for cholesterol level ? ?- Lipid panel ? ?4. Screening for deficiency anemia ? ?- CBC with Differential/Platelet  ? ?Follow up plan: ?Return in about 1 year (around 01/06/2023) for follow up. ?Come by and get labs at your convenience ? ? ? ? ? ?

## 2022-01-15 ENCOUNTER — Other Ambulatory Visit: Payer: Self-pay

## 2022-01-15 ENCOUNTER — Encounter: Payer: Self-pay | Admitting: Internal Medicine

## 2022-01-15 ENCOUNTER — Telehealth: Payer: Self-pay

## 2022-01-15 ENCOUNTER — Ambulatory Visit: Payer: Self-pay | Admitting: Nurse Practitioner

## 2022-01-15 ENCOUNTER — Ambulatory Visit: Payer: 59 | Admitting: Internal Medicine

## 2022-01-15 VITALS — BP 118/72 | HR 87 | Temp 98.1°F | Resp 16 | Ht 70.0 in | Wt 201.1 lb

## 2022-01-15 DIAGNOSIS — R1013 Epigastric pain: Secondary | ICD-10-CM | POA: Diagnosis not present

## 2022-01-15 DIAGNOSIS — R195 Other fecal abnormalities: Secondary | ICD-10-CM | POA: Diagnosis not present

## 2022-01-15 NOTE — Progress Notes (Signed)
? ?Acute Office Visit ? ?Subjective:  ? ? Patient ID: Timothy Padilla, male    DOB: 03/09/1991, 31 y.o.   MRN: [id number] ? ?Chief Complaint  ?Patient presents with  ? Abdominal Pain  ?  X 1 week  ? Black Stools  ? ? ?HPI ?Patient is in today for abdominal pain/dark stools. Woke up yesterday morning with severe abdominal pain, happens in episodes over last couple years. Epigastric. Last time 1 month ago. Lasts 3-4 days, resolved on its own. Chills yesterday, no fevers. Stool yesterday normal, then loose. This morning stool well formed but black. Cramping type pain initially, now a dull/pressure pain. Took Pepto-bismol yesterday morning. Pain does not appear to radiate but does have some back pain. No nausea, no vomiting, diarrhea yesterday. No fevers. Takes Omeprazole 40 mg for GERD, has been on for years. Controls symptoms. No urinary symptoms. Takes Aleve at least once a week for headaches, sometimes up to 4-5 times a week. No family history of colon cancer, no abdominal surgeries. Does use tobacco products, does not drink alcohol regularly. Appetite has been good, but does make cramping pain worse 15 minutes after eating. Tested for H. Pylori in November of last year, negative.  ? ?Past Medical History:  ?Diagnosis Date  ? Hypertension   ? Stress at work 03/27/2016  ? ? ?Past Surgical History:  ?Procedure Laterality Date  ? SCAPHOID FRACTURE SURGERY Left 2012  ? ? ?Family History  ?Problem Relation Age of Onset  ? Hypertension Father   ? Heart attack Maternal Grandmother   ? Hypertension Paternal Grandmother   ? Stroke Paternal Grandmother   ? COPD Paternal Grandfather   ? ? ?Social History  ? ?Socioeconomic History  ? Marital status: Single  ?  Spouse name: Not on file  ? Number of children: Not on file  ? Years of education: Not on file  ? Highest education level: Not on file  ?Occupational History  ? Not on file  ?Tobacco Use  ? Smoking status: Every Day  ?  Packs/day: 1.50  ?  Years: 14.00  ?  Pack years: 21.00  ?   Types: Cigarettes  ?  Start date: 01/07/2004  ? Smokeless tobacco: Former  ?  Types: Snuff  ? Tobacco comments:  ?  just tried smokeless 2 times, does not currently use  ?Vaping Use  ? Vaping Use: Never used  ?Substance and Sexual Activity  ? Alcohol use: Yes  ?  Alcohol/week: 2.0 - 3.0 standard drinks  ?  Types: 2 - 3 Cans of beer per week  ?  Comment: per day  ? Drug use: No  ? Sexual activity: Not Currently  ?  Partners: Female  ?  Birth control/protection: Condom  ?Other Topics Concern  ? Not on file  ?Social History Narrative  ? Not on file  ? ?Social Determinants of Health  ? ?Financial Resource Strain: Not on file  ?Food Insecurity: Not on file  ?Transportation Needs: Not on file  ?Physical Activity: Not on file  ?Stress: Not on file  ?Social Connections: Not on file  ?Intimate Partner Violence: Not on file  ? ? ?Outpatient Medications Prior to Visit  ?Medication Sig Dispense Refill  ? omeprazole (PRILOSEC) 40 MG capsule Take 1 capsule (40 mg total) by mouth daily. 90 capsule 3  ? ?No facility-administered medications prior to visit.  ? ? ?Allergies  ?Allergen Reactions  ? Wellbutrin [Bupropion]   ?  Tremor,fatigue,mood changes  ? ? ?Review of Systems  ?  Constitutional:  Positive for chills. Negative for appetite change, fever and unexpected weight change.  ?Gastrointestinal:  Positive for abdominal pain, blood in stool and diarrhea. Negative for constipation, nausea and vomiting.  ?Genitourinary:  Negative for dysuria, frequency and hematuria.  ?Musculoskeletal:  Positive for back pain.  ?Neurological:  Negative for dizziness and light-headedness.  ? ?   ?Objective:  ?  ?Physical Exam ?Constitutional:   ?   Appearance: He is well-developed.  ?HENT:  ?   Head: Normocephalic and atraumatic.  ?Eyes:  ?   Conjunctiva/sclera: Conjunctivae normal.  ?Cardiovascular:  ?   Rate and Rhythm: Normal rate and regular rhythm.  ?Pulmonary:  ?   Effort: Pulmonary effort is normal.  ?   Breath sounds: Normal breath sounds.   ?Abdominal:  ?   General: Abdomen is flat. Bowel sounds are normal. There is no distension.  ?   Palpations: Abdomen is soft.  ?   Tenderness: There is abdominal tenderness in the epigastric area. There is no right CVA tenderness, left CVA tenderness, guarding or rebound.  ?   Hernia: No hernia is present.  ?Musculoskeletal:  ?   Right lower leg: No edema.  ?   Left lower leg: No edema.  ?Skin: ?   General: Skin is warm and dry.  ?Neurological:  ?   General: No focal deficit present.  ?   Mental Status: He is alert. Mental status is at baseline.  ?Psychiatric:     ?   Mood and Affect: Mood normal.     ?   Behavior: Behavior normal.  ? ? ?BP 118/72   Pulse 87   Temp 98.1 ?F (36.7 ?C)   Resp 16   Ht 5\' 10"  (1.778 m)   Wt 201 lb 1.6 oz (91.2 kg)   SpO2 99%   BMI 28.85 kg/m?  ?Wt Readings from Last 3 Encounters:  ?01/15/22 201 lb 1.6 oz (91.2 kg)  ?01/05/22 199 lb 14.4 oz (90.7 kg)  ?11/15/20 215 lb 9.6 oz (97.8 kg)  ? ? ?There are no preventive care reminders to display for this patient. ? ?There are no preventive care reminders to display for this patient. ? ? ?Lab Results  ?Component Value Date  ? TSH 1.90 09/22/2019  ? ?Lab Results  ?Component Value Date  ? WBC 4.6 11/15/2020  ? HGB 17.6 (H) 11/15/2020  ? HCT 50.1 (H) 11/15/2020  ? MCV 89.8 11/15/2020  ? PLT 181 11/15/2020  ? ?Lab Results  ?Component Value Date  ? NA 139 11/15/2020  ? K 4.6 11/15/2020  ? CO2 28 11/15/2020  ? GLUCOSE 89 11/15/2020  ? BUN 13 11/15/2020  ? CREATININE 0.84 11/15/2020  ? BILITOT 1.3 (H) 11/15/2020  ? ALKPHOS 57 03/14/2017  ? AST 26 11/15/2020  ? ALT 42 11/15/2020  ? PROT 7.7 11/15/2020  ? ALBUMIN 4.4 03/14/2017  ? CALCIUM 9.7 11/15/2020  ? ?Lab Results  ?Component Value Date  ? CHOL 155 04/28/2020  ? ?Lab Results  ?Component Value Date  ? HDL 48 04/28/2020  ? ?Lab Results  ?Component Value Date  ? [place] 93 04/28/2020  ? ?Lab Results  ?Component Value Date  ? TRIG 58 04/28/2020  ? ?Lab Results  ?Component Value Date  ? CHOLHDL  3.2 04/28/2020  ? ?No results found for: HGBA1C ? ?   ?Assessment & Plan:  ? ?1. Epigastric pain: Concern for peptic ulcer, check CBC, fecal occult. Discontinue all anti-inflammatories, continue PPI. Plan for GI referral for EGD. Recheck  in 1 week. If symptoms worsen before then, recommend presenting to ER. ? ?- Lipase ?- Ambulatory referral to Gastroenterology ? ?2. Dark stools: Fecal occult pending.  ? ?- POCT Occult Blood Stool ? ? ?Teodora Medici, DO ? ?

## 2022-01-15 NOTE — Telephone Encounter (Signed)
Scheduled for 04/23/2022 ?

## 2022-01-15 NOTE — Patient Instructions (Addendum)
It was great seeing you today! ? ?Plan discussed at today's visit: ?-Blood work ordered today, results will be uploaded to MyChart.  ?-Heme occult card given to assess for blood in the stools  ?-Discontinue Aleve and Ibuprofen for now  ?-Continue Omeprazole daily  ? ?Follow up in: 1 week  ? ?Take care and let us know if you have any questions or concerns prior to your next visit. ? ?Dr. Caralee Ates ? ?Peptic Ulcer ?A peptic ulcer is a sore in the lining of the stomach (gastric ulcer) or the first part of the small intestine (duodenal ulcer). The ulcer causes a gradual wearing away (erosion) of the deeper tissue. ?What are the causes? ?Normally, the lining of the stomach and the small intestine protects them from the acid that digests food. The protective lining can be damaged by: ?An infection caused by a type of bacteria called Helicobacter pylori or H. pylori. ?Regular use of NSAIDs, such as ibuprofen or aspirin. ?Rare tumors in the stomach, small intestine, or pancreas (Zollinger-Ellison syndrome). ?What increases the risk? ?The following factors may make you more likely to develop this condition: ?Smoking. ?Having a family history of ulcer disease. ?Drinking alcohol. ?Having been hospitalized in an intensive care unit (ICU). ?What are the signs or symptoms? ?Symptoms of this condition include: ?Persistent burning pain in the area between the chest and the belly button. The pain may be worse on an empty stomach and at night. ?Heartburn. ?Nausea and vomiting. ?Bloating. ?If the ulcer results in bleeding, it can cause: ?Black, tarry stools. ?Vomiting of bright red blood. ?Vomiting of material that looks like coffee grounds. ?How is this diagnosed? ?This condition may be diagnosed based on: ?Your medical history and a physical exam. ?Various tests or procedures, such as: ?Upper endoscopy. The health care provider examines the esophagus, stomach, and small intestine using a small flexible tube that has a video camera at  the end. ?Blood tests, stool tests, or breath tests to check for the H. pylori bacteria. ?An X-ray exam (upper gastrointestinal series) of the esophagus, stomach, and small intestine. ?A biopsy to help find certain causes of ulcers. A tissue sample is removed during upper endoscopy to be examined under a microscope. ?How is this treated? ?Treatment for this condition may include: ?Eliminating the cause of the ulcer, such as smoking or use of NSAIDs, and limiting alcohol and caffeine intake. ?Medicines to reduce the amount of acid in your digestive tract. ?Antibiotic medicines, if the ulcer is caused by an H. pylori infection. ?An upper endoscopy may be used to treat a bleeding ulcer. ?Surgery. This may be needed if the bleeding is severe or if the ulcer created a hole somewhere in the digestive system. ?Follow these instructions at home: ?Do not drink alcohol if your health care provider tells you not to drink. ?Do not use any products that contain nicotine or tobacco. These products include cigarettes, chewing tobacco, and vaping devices, such as e-cigarettes. If you need help quitting, ask your health care provider. ?Take over-the-counter and prescription medicines only as told by your health care provider. ?Do not use over-the-counter medicines in place of prescription medicines unless your health care provider approves. ?Do not take aspirin, ibuprofen, or other NSAIDs unless your health care provider tells you to. ?Keep all follow-up visits. This is important. ?Contact a health care provider if: ?Your symptoms do not improve within 7 days of starting treatment. ?You have ongoing indigestion or heartburn. ?Get help right away if: ?You have sudden, sharp, or  persistent pain in your abdomen. ?You have bloody or dark black, tarry stools. ?You vomit blood or material that looks like coffee grounds. ?You become light-headed or you feel faint. ?You become weak. ?You become sweaty or clammy. ?These symptoms may be an  emergency. Get help right away. Call 911. ?Do not wait to see if the symptoms will go away. ?Do not drive yourself to the hospital. ?Summary ?A peptic ulcer is a sore in the lining of the stomach (gastric ulcer) or the first part of the small intestine (duodenal ulcer). The ulcer causes a gradual wearing away (erosion) of the deeper tissue. ?Do not use any products that contain nicotine or tobacco. These products include cigarettes, chewing tobacco, and vaping devices, such as e-cigarettes. If you need help quitting, ask your health care provider. ?Take over-the-counter and prescription medicines only as told by your health care provider. Do not use over-the-counter medicines in place of prescription medicines unless your health care provider approves. ?Limit your alcohol and caffeine intake. ?Keep all follow-up visits. This is important. ?This information is not intended to replace advice given to you by your health care provider. Make sure you discuss any questions you have with your health care provider. ?Document Revised: 06/02/2021 Document Reviewed: 06/02/2021 ?Elsevier Patient Education ? 2022 [employer] ?

## 2022-01-15 NOTE — Telephone Encounter (Signed)
Reason for Disposition ? [1] MILD-MODERATE pain AND [2] constant AND [3] present > 2 hours ? ?Answer Assessment - Initial Assessment Questions ?1. LOCATION: "Where does it hurt?"  ?    Having black stools with abd pain and back pain.   I've had abd pain for a month.   Yesterday it started again pain.  Sharp pain in back around shoulder blade on right and this morning abd pain this morning. ?This morning passing black stool. ? ?2. RADIATION: "Does the pain shoot anywhere else?" (e.g., chest, back) ?    See above ?3. ONSET: "When did the pain begin?" (Minutes, hours or days ago)  ?    Black stool this morning.   Yesterday loose stools started.   ?4. SUDDEN: "Gradual or sudden onset?" ?    *No Answer* ?5. PATTERN "Does the pain come and go, or is it constant?" ?   - If constant: "Is it getting better, staying the same, or worsening?"  ?    (Note: Constant means the pain never goes away completely; most serious pain is constant and it progresses)  ?   - If intermittent: "How long does it last?" "Do you have pain now?" ?    (Note: Intermittent means the pain goes away completely between bouts) ?    *No Answer* ?6. SEVERITY: "How bad is the pain?"  (e.g., Scale 1-10; mild, moderate, or severe) ?   - MILD (1-3): doesn't interfere with normal activities, abdomen soft and not tender to touch  ?   - MODERATE (4-7): interferes with normal activities or awakens from sleep, abdomen tender to touch  ?   - SEVERE (8-10): excruciating pain, doubled over, unable to do any normal activities   ?    *No Answer* ?7. RECURRENT SYMPTOM: "Have you ever had this type of stomach pain before?" If Yes, ask: "When was the last time?" and "What happened that time?"  ?    *No Answer* ?8. CAUSE: "What do you think is causing the stomach pain?" ?    *No Answer* ?9. RELIEVING/AGGRAVATING FACTORS: "What makes it better or worse?" (e.g., movement, antacids, bowel movement) ?    *No Answer* ?10. OTHER SYMPTOMS: "Do you have any other symptoms?" (e.g.,  back pain, diarrhea, fever, urination pain, vomiting) ?      Fatigue dizzy yesterday.   No dizziness this morning. ? ?Protocols used: Abdominal Pain - Male-A-AH ? ?Chief Complaint: black stool this morning.   Abd pain for the last week and a half ?Symptoms: loose stools yesterday, not black.   This morning stool was normal but black ?Frequency: Once this morning ?Pertinent Negatives: Patient denies bright red blood or having this happen before ?Disposition: [] ED /[] Urgent Care (no appt availability in office) / [x] Appointment(In office/virtual)/ []  [facility]/ [] Home Care/ [] Refused Recommended Disposition /[] [facility]/ []  Follow-up with PCP ?Additional Notes: Appt made for today with Dr. .  ?

## 2022-01-16 LAB — COMPLETE METABOLIC PANEL WITH GFR
AG Ratio: 1.6 (calc) (ref 1.0–2.5)
ALT: 18 U/L (ref 9–46)
AST: 14 U/L (ref 10–40)
Albumin: 4.4 g/dL (ref 3.6–5.1)
Alkaline phosphatase (APISO): 67 U/L (ref 36–130)
BUN: 11 mg/dL (ref 7–25)
CO2: 28 mmol/L (ref 20–32)
Calcium: 9.3 mg/dL (ref 8.6–10.3)
Chloride: 106 mmol/L (ref 98–110)
Creat: 0.72 mg/dL (ref 0.60–1.26)
Globulin: 2.8 g/dL (calc) (ref 1.9–3.7)
Glucose, Bld: 86 mg/dL (ref 65–99)
Potassium: 4.8 mmol/L (ref 3.5–5.3)
Sodium: 141 mmol/L (ref 135–146)
Total Bilirubin: 0.6 mg/dL (ref 0.2–1.2)
Total Protein: 7.2 g/dL (ref 6.1–8.1)
eGFR: 125 mL/min/{1.73_m2} (ref >=60)

## 2022-01-16 LAB — CBC WITH DIFFERENTIAL/PLATELET
Absolute Monocytes: 429 cells/uL (ref 200–950)
Basophils Absolute: 21 cells/uL (ref 0–200)
Basophils Relative: 0.4 %
Eosinophils Absolute: 42 cells/uL (ref 15–500)
Eosinophils Relative: 0.8 %
HCT: 48.2 % (ref 38.5–50.0)
Hemoglobin: 16.6 g/dL (ref 13.2–17.1)
Lymphs Abs: 1632 cells/uL (ref 850–3900)
MCH: 31.7 pg (ref 27.0–33.0)
MCHC: 34.4 g/dL (ref 32.0–36.0)
MCV: 92 fL (ref 80.0–100.0)
MPV: 11.4 fL (ref 7.5–12.5)
Monocytes Relative: 8.1 %
Neutro Abs: 3175 cells/uL (ref 1500–7800)
Neutrophils Relative %: 59.9 %
Platelets: 159 10*3/uL (ref 140–400)
RBC: 5.24 10*6/uL (ref 4.20–5.80)
RDW: 12.1 % (ref 11.0–15.0)
Total Lymphocyte: 30.8 %
WBC: 5.3 10*3/uL (ref 3.8–10.8)

## 2022-01-16 LAB — LIPID PANEL
Cholesterol: 149 mg/dL (ref <200)
HDL: 44 mg/dL (ref >=40)
LDL Cholesterol (Calc): 91 mg/dL (calc)
Non-HDL Cholesterol (Calc): 105 mg/dL (calc) (ref <130)
Total CHOL/HDL Ratio: 3.4 (calc) (ref <5.0)
Triglycerides: 56 mg/dL (ref <150)

## 2022-01-16 LAB — LIPASE: Lipase: 29 U/L (ref 7–60)

## 2022-01-19 ENCOUNTER — Ambulatory Visit (INDEPENDENT_AMBULATORY_CARE_PROVIDER_SITE_OTHER): Payer: 59

## 2022-01-19 ENCOUNTER — Other Ambulatory Visit: Payer: Self-pay

## 2022-01-19 DIAGNOSIS — R1013 Epigastric pain: Secondary | ICD-10-CM

## 2022-01-19 DIAGNOSIS — R195 Other fecal abnormalities: Secondary | ICD-10-CM

## 2022-01-19 LAB — POC HEMOCCULT BLD/STL (HOME/3-CARD/SCREEN)
Card #1 Date: 3142023
Card #2 Date: 3172023
Card #2 Fecal Occult Blod, POC: NEGATIVE
Fecal Occult Blood, POC: NEGATIVE

## 2022-01-23 ENCOUNTER — Ambulatory Visit: Payer: 59 | Admitting: Internal Medicine

## 2022-01-23 NOTE — Progress Notes (Deleted)
? ?Established Patient Office Visit ? ?Subjective:  ?Patient ID: Timothy Padilla, male    DOB: [birth year]  Age: 31 y.o. MRN: [id number] ? ?CC: No chief complaint on file. ? ? ?HPI ?Timothy Padilla presents for 1 week follow up for abdominal pain/dark stools. One week ago he was experiencing severe epigastric pain and dark stools. He had taken Pepto-bismol prior to dark stools. He is on a PPI which controls acid reflux symptoms. Had been tested for H. Pylori last year which was negative. Blood work ordered last week which was unremarkable, no anemia, kidneys, liver and electrolytes normal and lipase normal. Heme occult negative.  Today he states  ? ?Past Medical History:  ?Diagnosis Date  ? Hypertension   ? Stress at work 03/27/2016  ? ? ?Past Surgical History:  ?Procedure Laterality Date  ? SCAPHOID FRACTURE SURGERY Left 2012  ? ? ?Family History  ?Problem Relation Age of Onset  ? Hypertension Father   ? Heart attack Maternal Grandmother   ? Hypertension Paternal Grandmother   ? Stroke Paternal Grandmother   ? COPD Paternal Grandfather   ? ? ?Social History  ? ?Socioeconomic History  ? Marital status: Single  ?  Spouse name: Not on file  ? Number of children: Not on file  ? Years of education: Not on file  ? Highest education level: Not on file  ?Occupational History  ? Not on file  ?Tobacco Use  ? Smoking status: Every Day  ?  Packs/day: 1.50  ?  Years: 14.00  ?  Pack years: 21.00  ?  Types: Cigarettes  ?  Start date: 01/07/2004  ? Smokeless tobacco: Former  ?  Types: Snuff  ? Tobacco comments:  ?  just tried smokeless 2 times, does not currently use  ?Vaping Use  ? Vaping Use: Never used  ?Substance and Sexual Activity  ? Alcohol use: Yes  ?  Alcohol/week: 2.0 - 3.0 standard drinks  ?  Types: 2 - 3 Cans of beer per week  ?  Comment: per day  ? Drug use: No  ? Sexual activity: Not Currently  ?  Partners: Female  ?  Birth control/protection: Condom  ?Other Topics Concern  ? Not on file  ?Social History Narrative  ? Not on file   ? ?Social Determinants of Health  ? ?Financial Resource Strain: Not on file  ?Food Insecurity: Not on file  ?Transportation Needs: Not on file  ?Physical Activity: Not on file  ?Stress: Not on file  ?Social Connections: Not on file  ?Intimate Partner Violence: Not on file  ? ? ?Outpatient Medications Prior to Visit  ?Medication Sig Dispense Refill  ? omeprazole (PRILOSEC) 40 MG capsule Take 1 capsule (40 mg total) by mouth daily. 90 capsule 3  ? ?No facility-administered medications prior to visit.  ? ? ?Allergies  ?Allergen Reactions  ? Wellbutrin [Bupropion]   ?  Tremor,fatigue,mood changes  ? ? ?ROS ?Review of Systems ? ?  ?Objective:  ?  ?Physical Exam ? ?There were no vitals taken for this visit. ?Wt Readings from Last 3 Encounters:  ?01/15/22 201 lb 1.6 oz (91.2 kg)  ?01/05/22 199 lb 14.4 oz (90.7 kg)  ?11/15/20 215 lb 9.6 oz (97.8 kg)  ? ? ? ?Health Maintenance Due  ?Topic Date Due  ? COVID-19 Vaccine (1) Never done  ? ? ?There are no preventive care reminders to display for this patient. ? ?Lab Results  ?Component Value Date  ? TSH 1.90 09/22/2019  ? ?Lab  Results  ?Component Value Date  ? WBC 5.3 01/15/2022  ? HGB 16.6 01/15/2022  ? HCT 48.2 01/15/2022  ? MCV 92.0 01/15/2022  ? PLT 159 01/15/2022  ? ?Lab Results  ?Component Value Date  ? NA 141 01/15/2022  ? K 4.8 01/15/2022  ? CO2 28 01/15/2022  ? GLUCOSE 86 01/15/2022  ? BUN 11 01/15/2022  ? CREATININE 0.72 01/15/2022  ? BILITOT 0.6 01/15/2022  ? ALKPHOS 57 03/14/2017  ? AST 14 01/15/2022  ? ALT 18 01/15/2022  ? PROT 7.2 01/15/2022  ? ALBUMIN 4.4 03/14/2017  ? CALCIUM 9.3 01/15/2022  ? EGFR 125 01/15/2022  ? ?Lab Results  ?Component Value Date  ? CHOL 149 01/15/2022  ? ?Lab Results  ?Component Value Date  ? HDL 44 01/15/2022  ? ?Lab Results  ?Component Value Date  ? [place] 91 01/15/2022  ? ?Lab Results  ?Component Value Date  ? TRIG 56 01/15/2022  ? ?Lab Results  ?Component Value Date  ? CHOLHDL 3.4 01/15/2022  ? ?No results found for: HGBA1C ? ?   ?Assessment & Plan:  ? ?Problem List Items Addressed This Visit   ?None ? ? ?No orders of the defined types were placed in this encounter. ? ? ?Follow-up: No follow-ups on file.  ? ? ?Timothy Medici, DO ?

## 2022-04-23 ENCOUNTER — Ambulatory Visit: Payer: BC Managed Care – PPO | Admitting: Gastroenterology

## 2023-01-11 ENCOUNTER — Ambulatory Visit: Payer: 59 | Admitting: Nurse Practitioner

## 2023-01-11 NOTE — Progress Notes (Deleted)
   There were no vitals taken for this visit.   Subjective:    Patient ID: Timothy Padilla, male    DOB: 1991/10/01, 32 y.o.   MRN: [id number]  HPI: Timothy Padilla is a 32 y.o. male  No chief complaint on file.  GERD:     Relevant past medical, surgical, family and social history reviewed and updated as indicated. Interim medical history since our last visit reviewed. Allergies and medications reviewed and updated.  Review of Systems  Constitutional: Negative for fever or weight change.  Respiratory: Negative for cough and shortness of breath.   Cardiovascular: Negative for chest pain or palpitations.  Gastrointestinal: Negative for abdominal pain, no bowel changes.  Musculoskeletal: Negative for gait problem or joint swelling.  Skin: Negative for rash.  Neurological: Negative for dizziness or headache.  No other specific complaints in a complete review of systems (except as listed in HPI above).      Objective:    There were no vitals taken for this visit.  Wt Readings from Last 3 Encounters:  01/15/22 201 lb 1.6 oz (91.2 kg)  01/05/22 199 lb 14.4 oz (90.7 kg)  11/15/20 215 lb 9.6 oz (97.8 kg)    Physical Exam  Constitutional: Patient appears well-developed and well-nourished.  No distress.  HEENT: head atraumatic, normocephalic, pupils equal and reactive to light, neck supple Cardiovascular: Normal rate, regular rhythm and normal heart sounds.  No murmur heard. No BLE edema. Pulmonary/Chest: Effort normal and breath sounds normal. No respiratory distress. Abdominal: Soft.  There is no tenderness. Psychiatric: Patient has a normal mood and affect. behavior is normal. Judgment and thought content normal.   Results for orders placed or performed in visit on 01/19/22  POC Hemoccult Bld/Stl (3-Cd Home Screen)  Result Value Ref Range   Card #1 Date [id number]    Fecal Occult Blood, POC Negative Negative   Card #2 Date [id number]    Card #2 Fecal Occult Blod, POC Negative    Card  #3 Date     Card #3 Fecal Occult Blood, POC        Assessment & Plan:     Follow up plan: No follow-ups on file.

## 2023-01-29 ENCOUNTER — Other Ambulatory Visit: Payer: Self-pay | Admitting: Nurse Practitioner

## 2023-01-29 DIAGNOSIS — K219 Gastro-esophageal reflux disease without esophagitis: Secondary | ICD-10-CM

## 2023-01-30 NOTE — Telephone Encounter (Signed)
Future OV scheduled 02/25/23.  Requested Prescriptions  Pending Prescriptions Disp Refills   omeprazole (PRILOSEC) 40 MG capsule [Pharmacy Med Name: OMEPRAZOLE DR 40 MG CAPSULE] 90 capsule 0    Sig: TAKE 1 CAPSULE (40 MG TOTAL) BY MOUTH DAILY.     Gastroenterology: Proton Pump Inhibitors Failed - 01/29/2023  6:27 PM      Failed - Valid encounter within last 12 months    Recent Outpatient Visits           1 year ago Epigastric pain   [facility] Teodora Medici, DO   1 year ago Gastroesophageal reflux disease, unspecified whether esophagitis present   [facility] Bo Merino, FNP   2 years ago Epigastric abdominal pain   [facility] Delsa Grana, PA-C   2 years ago Adult general medical exam   [facility] Delsa Grana, PA-C   2 years ago Other viral warts   [facility] Towanda Malkin, MD       Future Appointments             In 3 weeks Reece Packer, Myna Hidalgo, [facility], [facility]

## 2023-02-22 NOTE — Progress Notes (Unsigned)
   There were no vitals taken for this visit.   Subjective:    Patient ID: Timothy Padilla, male    DOB: July 07, 1991, 32 y.o.   MRN: [id number]  HPI: Timothy Padilla is a 32 y.o. male  No chief complaint on file.  GERD: he currently takes omeprazole 40 mg daily.    Relevant past medical, surgical, family and social history reviewed and updated as indicated. Interim medical history since our last visit reviewed. Allergies and medications reviewed and updated.  Review of Systems  Constitutional: Negative for fever or weight change.  Respiratory: Negative for cough and shortness of breath.   Cardiovascular: Negative for chest pain or palpitations.  Gastrointestinal: Negative for abdominal pain, no bowel changes.  Musculoskeletal: Negative for gait problem or joint swelling.  Skin: Negative for rash.  Neurological: Negative for dizziness or headache.  No other specific complaints in a complete review of systems (except as listed in HPI above).      Objective:    There were no vitals taken for this visit.  Wt Readings from Last 3 Encounters:  01/15/22 201 lb 1.6 oz (91.2 kg)  01/05/22 199 lb 14.4 oz (90.7 kg)  11/15/20 215 lb 9.6 oz (97.8 kg)    Physical Exam  Constitutional: Patient appears well-developed and well-nourished.  No distress.  HEENT: head atraumatic, normocephalic, pupils equal and reactive to light, neck supple Cardiovascular: Normal rate, regular rhythm and normal heart sounds.  No murmur heard. No BLE edema. Pulmonary/Chest: Effort normal and breath sounds normal. No respiratory distress. Abdominal: Soft.  There is no tenderness. Psychiatric: Patient has a normal mood and affect. behavior is normal. Judgment and thought content normal.   Results for orders placed or performed in visit on 01/19/22  POC Hemoccult Bld/Stl (3-Cd Home Screen)  Result Value Ref Range   Card #1 Date [id number]    Fecal Occult Blood, POC Negative Negative   Card #2 Date [id number]    Card #2  Fecal Occult Blod, POC Negative    Card #3 Date     Card #3 Fecal Occult Blood, POC        Assessment & Plan:   Problem List Items Addressed This Visit   None    Follow up plan: No follow-ups on file.

## 2023-02-25 ENCOUNTER — Encounter: Payer: Self-pay | Admitting: Nurse Practitioner

## 2023-02-25 ENCOUNTER — Ambulatory Visit: Payer: 59 | Admitting: Nurse Practitioner

## 2023-02-25 VITALS — BP 118/70 | HR 84 | Temp 97.8°F | Resp 16 | Ht 70.0 in | Wt 209.2 lb

## 2023-02-25 DIAGNOSIS — Z13220 Encounter for screening for lipoid disorders: Secondary | ICD-10-CM | POA: Diagnosis not present

## 2023-02-25 DIAGNOSIS — K219 Gastro-esophageal reflux disease without esophagitis: Secondary | ICD-10-CM

## 2023-02-25 DIAGNOSIS — Z131 Encounter for screening for diabetes mellitus: Secondary | ICD-10-CM | POA: Diagnosis not present

## 2023-02-25 DIAGNOSIS — Z130 Encounter for screening for diseases of the blood and blood-forming organs and certain disorders involving the immune mechanism: Secondary | ICD-10-CM | POA: Diagnosis not present

## 2023-02-25 MED ORDER — OMEPRAZOLE 40 MG PO CPDR: 40.0000 mg | DELAYED_RELEASE_CAPSULE | Freq: Every day | ORAL | 3 refills | Status: DC

## 2023-02-25 NOTE — Assessment & Plan Note (Signed)
Continue taking omeprazole 40 mg daily.  Continue to avoid food triggers. 

## 2023-02-26 LAB — COMPLETE METABOLIC PANEL WITH GFR
AG Ratio: 1.5 (calc) (ref 1.0–2.5)
ALT: 30 U/L (ref 9–46)
AST: 16 U/L (ref 10–40)
Albumin: 4.3 g/dL (ref 3.6–5.1)
Alkaline phosphatase (APISO): 66 U/L (ref 36–130)
BUN: 17 mg/dL (ref 7–25)
CO2: 30 mmol/L (ref 20–32)
Calcium: 9.4 mg/dL (ref 8.6–10.3)
Chloride: 106 mmol/L (ref 98–110)
Creat: 0.69 mg/dL (ref 0.60–1.26)
Globulin: 2.9 g/dL (calc) (ref 1.9–3.7)
Glucose, Bld: 59 mg/dL — ABNORMAL LOW (ref 65–99)
Potassium: 4.3 mmol/L (ref 3.5–5.3)
Sodium: 142 mmol/L (ref 135–146)
Total Bilirubin: 0.7 mg/dL (ref 0.2–1.2)
Total Protein: 7.2 g/dL (ref 6.1–8.1)
eGFR: 126 mL/min/{1.73_m2} (ref >=60)

## 2023-02-26 LAB — HEMOGLOBIN A1C
Hgb A1c MFr Bld: 5 % of total Hgb (ref <5.7)
Mean Plasma Glucose: 97 mg/dL
eAG (mmol/L): 5.4 mmol/L

## 2023-02-26 LAB — CBC WITH DIFFERENTIAL/PLATELET
Absolute Monocytes: 381 cells/uL (ref 200–950)
Basophils Absolute: 22 cells/uL (ref 0–200)
Basophils Relative: 0.4 %
Eosinophils Absolute: 151 cells/uL (ref 15–500)
Eosinophils Relative: 2.7 %
HCT: 48.6 % (ref 38.5–50.0)
Hemoglobin: 16.8 g/dL (ref 13.2–17.1)
Lymphs Abs: 1730 cells/uL (ref 850–3900)
MCH: 31.5 pg (ref 27.0–33.0)
MCHC: 34.6 g/dL (ref 32.0–36.0)
MCV: 91.2 fL (ref 80.0–100.0)
MPV: 10.3 fL (ref 7.5–12.5)
Monocytes Relative: 6.8 %
Neutro Abs: 3315 cells/uL (ref 1500–7800)
Neutrophils Relative %: 59.2 %
Platelets: 166 10*3/uL (ref 140–400)
RBC: 5.33 10*6/uL (ref 4.20–5.80)
RDW: 11.9 % (ref 11.0–15.0)
Total Lymphocyte: 30.9 %
WBC: 5.6 10*3/uL (ref 3.8–10.8)

## 2023-02-26 LAB — LIPID PANEL
Cholesterol: 171 mg/dL (ref <200)
HDL: 52 mg/dL (ref >=40)
LDL Cholesterol (Calc): 98 mg/dL (calc)
Non-HDL Cholesterol (Calc): 119 mg/dL (calc) (ref <130)
Total CHOL/HDL Ratio: 3.3 (calc) (ref <5.0)
Triglycerides: 118 mg/dL (ref <150)

## 2023-04-23 NOTE — Progress Notes (Deleted)
Name: Timothy Padilla   MRN: [id number]    DOB: [birth year]   Date:04/23/2023       Progress Note  Subjective  Chief Complaint  No chief complaint on file.   HPI  Patient presents for annual CPE ***.  IPSS Questionnaire (AUA-7): Over the past month.   1)  How often have you had a sensation of not emptying your bladder completely after you finish urinating?  {Rating:19227}  2)  How often have you had to urinate again less than two hours after you finished urinating? {Rating:19227}  3)  How often have you found you stopped and started again several times when you urinated?  {Rating:19227}  4) How difficult have you found it to postpone urination?  {Rating:19227}  5) How often have you had a weak urinary stream?  {Rating:19227}  6) How often have you had to push or strain to begin urination?  {Rating:19227}  7) How many times did you most typically get up to urinate from the time you went to bed until the time you got up in the morning?  {Rating:19228}  Total score:  0-7 mildly symptomatic   8-19 moderately symptomatic   20-35 severely symptomatic     Diet: *** Exercise: *** Last Dental Exam: **** Last Eye Exam: ***  Depression: phq 9 is {gen pos neg:315643}    02/25/2023    8:07 AM 01/15/2022    1:14 PM 01/05/2022    1:09 PM 11/15/2020    9:52 AM 04/28/2020    3:21 PM  Depression screen PHQ 2/9  Decreased Interest 0 0 0 0 0  Down, Depressed, Hopeless 0 0 0 0 0  PHQ - 2 Score 0 0 0 0 0  Altered sleeping 0 0   1  Tired, decreased energy 0 0   1  Change in appetite 0 0   0  Feeling bad or failure about yourself  0 0   0  Trouble concentrating 0 0   0  Moving slowly or fidgety/restless 0 0   0  Suicidal thoughts 0 0   0  PHQ-9 Score 0 0   2  Difficult doing work/chores Not difficult at all Not difficult at all   Not difficult at all    Hypertension:  BP Readings from Last 3 Encounters:  02/25/23 118/70  01/15/22 118/72  01/05/22 118/78    Obesity: Wt Readings from Last 3  Encounters:  02/25/23 209 lb 3.2 oz (94.9 kg)  01/15/22 201 lb 1.6 oz (91.2 kg)  01/05/22 199 lb 14.4 oz (90.7 kg)   BMI Readings from Last 3 Encounters:  02/25/23 30.02 kg/m  01/15/22 28.85 kg/m  01/05/22 28.68 kg/m     Lipids:  Lab Results  Component Value Date   CHOL 171 02/25/2023   CHOL 149 01/15/2022   CHOL 155 04/28/2020   Lab Results  Component Value Date   HDL 52 02/25/2023   HDL 44 01/15/2022   HDL 48 04/28/2020   Lab Results  Component Value Date   LDLCALC 98 02/25/2023   LDLCALC 91 01/15/2022   LDLCALC 93 04/28/2020   Lab Results  Component Value Date   TRIG 118 02/25/2023   TRIG 56 01/15/2022   TRIG 58 04/28/2020   Lab Results  Component Value Date   CHOLHDL 3.3 02/25/2023   CHOLHDL 3.4 01/15/2022   CHOLHDL 3.2 04/28/2020   No results found for: "LDLDIRECT" Glucose:  Glucose, Bld  Date Value Ref Range Status  02/25/2023 59 (L)  65 - 99 mg/dL Final    Comment:    .            Fasting reference interval .   01/15/2022 86 65 - 99 mg/dL Final    Comment:    .            Fasting reference interval .   11/15/2020 89 65 - 99 mg/dL Final    Comment:    .            Fasting reference interval .     Flowsheet Row Office Visit from 01/05/2022 in [facility]  AUDIT-C Score 0      ***  Single STD testing and prevention (HIV/chl/gon/syphilis):  {yes/no/default n/a:21102::"not applicable"} Sexual history:  Hep C Screening:  Skin cancer: Discussed monitoring for atypical lesions Colorectal cancer: *** Prostate cancer:  {yes/no/default n/a:21102::"not applicable"} No results found for: "PSA"   Lung cancer:  Low Dose CT Chest recommended if Age 22-80 years, 30 pack-year currently smoking OR have quit w/in 15years. Patient  {Response; is/is not/na:63} a candidate for screening   AAA: The USPSTF recommends one-time screening with ultrasonography in men ages 72 to 75 years who have ever smoked. Patient   {Response;  is /is not/na:63}, a candidate for screening  ECG:  ***  Vaccines:   HPV:  Tdap:  Shingrix:  Pneumonia:  Flu:  COVID-19:  Advanced Care Planning: A voluntary discussion about advance care planning including the explanation and discussion of advance directives.  Discussed health care proxy and Living will, and the patient was able to identify a health care proxy as ***.  Patient {DOES_DOES ZOX:09604} have a living will and power of attorney of health care   Patient Active Problem List   Diagnosis Date Noted   Gastroesophageal reflux disease 04/28/2020   Other viral warts 04/12/2020   Tobacco abuse 01/11/2018   Abnormal x-ray of thoracic spine 01/11/2018   Elevated blood pressure reading 03/27/2016    Past Surgical History:  Procedure Laterality Date   SCAPHOID FRACTURE SURGERY Left 2012    Family History  Problem Relation Age of Onset   Hypertension Father    Heart attack Maternal Grandmother    Hypertension Paternal Grandmother    Stroke Paternal Grandmother    COPD Paternal Grandfather     Social History   Socioeconomic History   Marital status: Single    Spouse name: Not on file   Number of children: Not on file   Years of education: Not on file   Highest education level: Not on file  Occupational History   Not on file  Tobacco Use   Smoking status: Every Day    Packs/day: 1.50    Years: 14.00    Additional pack years: 0.00    Total pack years: 21.00    Types: Cigarettes    Start date: 01/07/2004   Smokeless tobacco: Former    Types: Snuff   Tobacco comments:    just tried smokeless 2 times, does not currently use  Vaping Use   Vaping Use: Never used  Substance and Sexual Activity   Alcohol use: Yes    Alcohol/week: 2.0 - 3.0 standard drinks of alcohol    Types: 2 - 3 Cans of beer per week    Comment: per day   Drug use: No   Sexual activity: Not Currently    Partners: Female    Birth control/protection: Condom  Other Topics Concern   Not on file  Social History Narrative   Not on file   Social Determinants of Health   Financial Resource Strain: Not on file  Food Insecurity: Not on file  Transportation Needs: Not on file  Physical Activity: Not on file  Stress: Not on file  Social Connections: Not on file  Intimate Partner Violence: Not on file     Current Outpatient Medications:    omeprazole (PRILOSEC) 40 MG capsule, Take 1 capsule (40 mg total) by mouth daily., Disp: 90 capsule, Rfl: 3  Allergies  Allergen Reactions   Wellbutrin [Bupropion]     Tremor,fatigue,mood changes     ROS  ***   Objective  There were no vitals filed for this visit.  There is no height or weight on file to calculate BMI.  Physical Exam ***  Recent Results (from the past 2160 hour(s))  CBC with Differential/Platelet     Status: None   Collection Time: 02/25/23  8:21 AM  Result Value Ref Range   WBC 5.6 3.8 - 10.8 Thousand/uL   RBC 5.33 4.20 - 5.80 Million/uL   Hemoglobin 16.8 13.2 - 17.1 g/dL   HCT 16.1 09.6 - 04.5 %   MCV 91.2 80.0 - 100.0 fL   MCH 31.5 27.0 - 33.0 pg   MCHC 34.6 32.0 - 36.0 g/dL   RDW 40.9 81.1 - 91.4 %   Platelets 166 140 - 400 Thousand/uL   MPV 10.3 7.5 - 12.5 fL   Neutro Abs 3,315 1,500 - 7,800 cells/uL   Lymphs Abs 1,730 850 - 3,900 cells/uL   Absolute Monocytes 381 200 - 950 cells/uL   Eosinophils Absolute 151 15 - 500 cells/uL   Basophils Absolute 22 0 - 200 cells/uL   Neutrophils Relative % 59.2 %   Total Lymphocyte 30.9 %   Monocytes Relative 6.8 %   Eosinophils Relative 2.7 %   Basophils Relative 0.4 %  COMPLETE METABOLIC PANEL WITH GFR     Status: Abnormal   Collection Time: 02/25/23  8:21 AM  Result Value Ref Range   Glucose, Bld 59 (L) 65 - 99 mg/dL    Comment: .            Fasting reference interval .    BUN 17 7 - 25 mg/dL   Creat 7.82 9.56 - 2.13 mg/dL   eGFR 086 > OR = 60 VH/QIO/9.62X5   BUN/Creatinine Ratio SEE NOTE: 6 - 22 (calc)    Comment:    Not Reported: BUN and  Creatinine are within    reference range. .    Sodium 142 135 - 146 mmol/L   Potassium 4.3 3.5 - 5.3 mmol/L   Chloride 106 98 - 110 mmol/L   CO2 30 20 - 32 mmol/L   Calcium 9.4 8.6 - 10.3 mg/dL   Total Protein 7.2 6.1 - 8.1 g/dL   Albumin 4.3 3.6 - 5.1 g/dL   Globulin 2.9 1.9 - 3.7 g/dL (calc)   AG Ratio 1.5 1.0 - 2.5 (calc)   Total Bilirubin 0.7 0.2 - 1.2 mg/dL   Alkaline phosphatase (APISO) 66 36 - 130 U/L   AST 16 10 - 40 U/L   ALT 30 9 - 46 U/L  Lipid panel     Status: None   Collection Time: 02/25/23  8:21 AM  Result Value Ref Range   Cholesterol 171 <200 mg/dL   HDL 52 > OR = 40 mg/dL   Triglycerides 284 <132 mg/dL   LDL Cholesterol (Calc) 98 mg/dL (calc)  Comment: Reference range: <100 . Desirable range <100 mg/dL for primary prevention;   <70 mg/dL for patients with CHD or diabetic patients  with > or = 2 CHD risk factors. Marland Kitchen LDL-C is now calculated using the Martin-Hopkins  calculation, which is a validated novel method providing  better accuracy than the Friedewald equation in the  estimation of LDL-C.  Horald Pollen et al. Lenox Ahr. 4540;981(19): 2061-2068  (http://education.[facility].com/faq/FAQ164)    Total CHOL/HDL Ratio 3.3 <5.0 (calc)   Non-HDL Cholesterol (Calc) 119 <130 mg/dL (calc)    Comment: For patients with diabetes plus 1 major ASCVD risk  factor, treating to a non-HDL-C goal of <100 mg/dL  (LDL-C of <14 mg/dL) is considered a therapeutic  option.   Hemoglobin A1c     Status: None   Collection Time: 02/25/23  8:21 AM  Result Value Ref Range   Hgb A1c MFr Bld 5.0 <5.7 % of total Hgb    Comment: For the purpose of screening for the presence of diabetes: . <5.7%       Consistent with the absence of diabetes 5.7-6.4%    Consistent with increased risk for diabetes             (prediabetes) > or =6.5%  Consistent with diabetes . This assay result is consistent with a decreased risk of diabetes. . Currently, no consensus exists regarding use  of hemoglobin A1c for diagnosis of diabetes in children. . According to American Diabetes Association (ADA) guidelines, hemoglobin A1c <7.0% represents optimal control in non-pregnant diabetic patients. Different metrics may apply to specific patient populations.  Standards of Medical Care in Diabetes(ADA). .    Mean Plasma Glucose 97 mg/dL   eAG (mmol/L) 5.4 mmol/L    Comment: . This test was performed on the Roche cobas c503 platform. Effective 08/13/22, a change in test platforms from the Abbott Architect to the Roche cobas c503 may have shifted HbA1c results compared to historical results. Based on laboratory validation testing conducted at [facility], the Roche platform relative to the Abbott platform had an average increase in HbA1c value of < or = 0.3%. This difference is within accepted  variability established by the [facility]. Note that not all individuals will have had a shift in their results and direct comparisons between historical and current results for testing conducted on different platforms is not recommended.      Fall Risk:    02/25/2023    8:07 AM 01/15/2022    1:14 PM 01/05/2022    1:09 PM 11/15/2020    9:51 AM 04/28/2020    3:21 PM  Fall Risk   Falls in the past year? 0 0 0 0 0  Number falls in past yr: 0 0 0 0 0  Injury with Fall? 0 0 0 0 0  Risk for fall due to : No Fall Risks      Follow up Falls prevention discussed;Falls evaluation completed;Education provided  Falls evaluation completed Falls evaluation completed      Functional Status Survey:      Assessment & Plan  There are no diagnoses linked to this encounter.   -Prostate cancer screening and PSA options (with potential risks and benefits of testing vs not testing) were discussed along with recent recs/guidelines. -USPSTF grade A and B recommendations reviewed with patient; age-appropriate recommendations, preventive care, screening tests, etc  discussed and encouraged; healthy living encouraged; see AVS for patient education given to patient -Discussed importance of 150 minutes of physical activity weekly,  eat two servings of fish weekly, eat one serving of tree nuts ( cashews, pistachios, pecans, almonds.Marland Kitchen) every other day, eat 6 servings of fruit/vegetables daily and drink plenty of water and avoid sweet beverages.  -Reviewed Health Maintenance: {yes/no/default n/a:21102::"not applicable"}

## 2023-04-30 ENCOUNTER — Encounter: Payer: 59 | Admitting: Nurse Practitioner

## 2024-03-23 ENCOUNTER — Other Ambulatory Visit: Payer: Self-pay | Admitting: Nurse Practitioner

## 2024-03-23 DIAGNOSIS — K219 Gastro-esophageal reflux disease without esophagitis: Secondary | ICD-10-CM

## 2024-03-25 NOTE — Telephone Encounter (Signed)
 Requested medication (s) are due for refill today: Yes  Requested medication (s) are on the active medication list: Yes  Last refill:    Future visit scheduled: No  Notes to clinic:  Pt. Needs OV, voice mailbox full, unable to leave a message.    Requested Prescriptions  Pending Prescriptions Disp Refills   omeprazole  (PRILOSEC) 40 MG capsule [Pharmacy Med Name: OMEPRAZOLE  DR 40 MG CAPSULE] 90 capsule 3    Sig: Take 1 capsule (40 mg total) by mouth daily.     Gastroenterology: Proton Pump Inhibitors Failed - 03/25/2024  1:01 PM      Failed - Valid encounter within last 12 months    Recent Outpatient Visits   None

## 2024-03-26 NOTE — Progress Notes (Deleted)
 Aaron Aas

## 2024-03-27 ENCOUNTER — Ambulatory Visit (INDEPENDENT_AMBULATORY_CARE_PROVIDER_SITE_OTHER): Admitting: Nurse Practitioner

## 2024-03-27 ENCOUNTER — Encounter: Payer: Self-pay | Admitting: Nurse Practitioner

## 2024-03-27 VITALS — BP 120/76 | HR 73 | Temp 98.1°F | Ht 70.0 in | Wt 222.6 lb

## 2024-03-27 DIAGNOSIS — Z130 Encounter for screening for diseases of the blood and blood-forming organs and certain disorders involving the immune mechanism: Secondary | ICD-10-CM

## 2024-03-27 DIAGNOSIS — Z13220 Encounter for screening for lipoid disorders: Secondary | ICD-10-CM

## 2024-03-27 DIAGNOSIS — K219 Gastro-esophageal reflux disease without esophagitis: Secondary | ICD-10-CM | POA: Diagnosis not present

## 2024-03-27 DIAGNOSIS — Z131 Encounter for screening for diabetes mellitus: Secondary | ICD-10-CM

## 2024-03-27 MED ORDER — OMEPRAZOLE 40 MG PO CPDR: 40.0000 mg | DELAYED_RELEASE_CAPSULE | Freq: Every day | ORAL | 3 refills | Status: AC

## 2024-03-27 MED ORDER — FAMOTIDINE 20 MG PO TABS: 20.0000 mg | ORAL_TABLET | Freq: Two times a day (BID) | ORAL | 0 refills | Status: DC

## 2024-03-27 NOTE — Assessment & Plan Note (Signed)
 Continue taking Omeprazole  40mg  daily. Begin taking the Famotidine 20mg  twice daily. Continue with healthy lifestyle choices to avoid food triggers.

## 2024-03-27 NOTE — Progress Notes (Signed)
 BP 120/76   Pulse 73   Temp 98.1 F (36.7 C)   Ht 5\' 10"  (1.778 m)   Wt 222 lb 9.6 oz (101 kg)   SpO2 99%   BMI 31.94 kg/m    Subjective:    Patient ID: Timothy Padilla, male    DOB: 1991-08-19, 33 y.o.   MRN: [id number]  HPI: Timothy Padilla is a 33 y.o. male Chief Complaint  Patient presents with   Medical Management of Chronic Issues   Medication Refill   GERD:  Patient is following up today about his GERD. He reports that he has been taking the Omeprazole  daily. He reports that twice a week he is experiencing upper abdominal pressure and needs to take TUMS to relieve the pressure. Denies any new food triggers.         03/27/2024    8:02 AM 02/25/2023    8:07 AM 01/15/2022    1:14 PM  Depression screen PHQ 2/9  Decreased Interest 0 0 0  Down, Depressed, Hopeless 0 0 0  PHQ - 2 Score 0 0 0  Altered sleeping 0 0 0  Tired, decreased energy 0 0 0  Change in appetite 0 0 0  Feeling bad or failure about yourself  0 0 0  Trouble concentrating 0 0 0  Moving slowly or fidgety/restless 0 0 0  Suicidal thoughts 0 0 0  PHQ-9 Score 0 0 0  Difficult doing work/chores Not difficult at all Not difficult at all Not difficult at all    Relevant past medical, surgical, family and social history reviewed and updated as indicated. Interim medical history since our last visit reviewed. Allergies and medications reviewed and updated.  Review of Systems  Constitutional: Negative for fever or weight change.  Respiratory: Negative for cough and shortness of breath.   Cardiovascular: Negative for chest pain or palpitations.  Gastrointestinal: Negative for abdominal pain, no bowel changes.  Musculoskeletal: Negative for gait problem or joint swelling.  Skin: Negative for rash.  Neurological: Negative for dizziness or headache.  No other specific complaints in a complete review of systems (except as listed in HPI above).      Objective:      BP 120/76   Pulse 73   Temp 98.1 F (36.7  C)   Ht 5\' 10"  (1.778 m)   Wt 222 lb 9.6 oz (101 kg)   SpO2 99%   BMI 31.94 kg/m    Wt Readings from Last 3 Encounters:  03/27/24 222 lb 9.6 oz (101 kg)  02/25/23 209 lb 3.2 oz (94.9 kg)  01/15/22 201 lb 1.6 oz (91.2 kg)    Physical Exam Constitutional:      Appearance: Normal appearance.  Cardiovascular:     Rate and Rhythm: Normal rate and regular rhythm.  Pulmonary:     Effort: Pulmonary effort is normal.     Breath sounds: Normal breath sounds.  Neurological:     Mental Status: He is alert.      Results for orders placed or performed in visit on 02/25/23  CBC with Differential/Platelet   Collection Time: 02/25/23  8:21 AM  Result Value Ref Range   WBC 5.6 3.8 - 10.8 Thousand/uL   RBC 5.33 4.20 - 5.80 Million/uL   Hemoglobin 16.8 13.2 - 17.1 g/dL   HCT 40.9 81.1 - 91.4 %   MCV 91.2 80.0 - 100.0 fL   MCH 31.5 27.0 - 33.0 pg   MCHC 34.6 32.0 - 36.0 g/dL  RDW 11.9 11.0 - 15.0 %   Platelets 166 140 - 400 Thousand/uL   MPV 10.3 7.5 - 12.5 fL   Neutro Abs 3,315 1,500 - 7,800 cells/uL   Lymphs Abs 1,730 850 - 3,900 cells/uL   Absolute Monocytes 381 200 - 950 cells/uL   Eosinophils Absolute 151 15 - 500 cells/uL   Basophils Absolute 22 0 - 200 cells/uL   Neutrophils Relative % 59.2 %   Total Lymphocyte 30.9 %   Monocytes Relative 6.8 %   Eosinophils Relative 2.7 %   Basophils Relative 0.4 %  COMPLETE METABOLIC PANEL WITH GFR   Collection Time: 02/25/23  8:21 AM  Result Value Ref Range   Glucose, Bld 59 (L) 65 - 99 mg/dL   BUN 17 7 - 25 mg/dL   Creat 4.09 8.11 - 9.14 mg/dL   eGFR 782 > OR = 60 NF/AOZ/3.08M5   BUN/Creatinine Ratio SEE NOTE: 6 - 22 (calc)   Sodium 142 135 - 146 mmol/L   Potassium 4.3 3.5 - 5.3 mmol/L   Chloride 106 98 - 110 mmol/L   CO2 30 20 - 32 mmol/L   Calcium 9.4 8.6 - 10.3 mg/dL   Total Protein 7.2 6.1 - 8.1 g/dL   Albumin 4.3 3.6 - 5.1 g/dL   Globulin 2.9 1.9 - 3.7 g/dL (calc)   AG Ratio 1.5 1.0 - 2.5 (calc)   Total Bilirubin 0.7 0.2  - 1.2 mg/dL   Alkaline phosphatase (APISO) 66 36 - 130 U/L   AST 16 10 - 40 U/L   ALT 30 9 - 46 U/L  Lipid panel   Collection Time: 02/25/23  8:21 AM  Result Value Ref Range   Cholesterol 171 <200 mg/dL   HDL 52 > OR = 40 mg/dL   Triglycerides 784 <696 mg/dL   LDL Cholesterol (Calc) 98 mg/dL (calc)   Total CHOL/HDL Ratio 3.3 <5.0 (calc)   Non-HDL Cholesterol (Calc) 119 <130 mg/dL (calc)  Hemoglobin E9B   Collection Time: 02/25/23  8:21 AM  Result Value Ref Range   Hgb A1c MFr Bld 5.0 <5.7 % of total Hgb   Mean Plasma Glucose 97 mg/dL   eAG (mmol/L) 5.4 mmol/L          Assessment & Plan:   Problem List Items Addressed This Visit       Digestive   Gastroesophageal reflux disease - Primary   Continue taking Omeprazole  40mg  daily. Begin taking the Famotidine 20mg  twice daily. Continue with healthy lifestyle choices to avoid food triggers.       Relevant Medications   omeprazole  (PRILOSEC) 40 MG capsule   famotidine (PEPCID) 20 MG tablet   Other Visit Diagnoses       Screening for cholesterol level       Relevant Orders   Lipid panel     Screening for diabetes mellitus       Relevant Orders   Comprehensive metabolic panel with GFR   Hemoglobin A1c     Screening for deficiency anemia       Relevant Orders   CBC with Differential/Platelet              Follow up plan: Return in about 6 months (around 09/27/2024) for GERD.   I have reviewed this encounter including the documentation in this note and/or discussed this patient with the provider, Aislinn Womack, SNP, I am certifying that I agree with the content of this note as supervising/preceptor nurse practitioner.  Donny Gall, FNP-C [facility] 03/27/2024, 9:03 AM

## 2024-03-31 ENCOUNTER — Ambulatory Visit: Payer: Self-pay | Admitting: Nurse Practitioner

## 2024-04-18 ENCOUNTER — Other Ambulatory Visit: Payer: Self-pay | Admitting: Nurse Practitioner

## 2024-04-18 DIAGNOSIS — K219 Gastro-esophageal reflux disease without esophagitis: Secondary | ICD-10-CM

## 2024-04-21 NOTE — Telephone Encounter (Signed)
 Requested Prescriptions  Pending Prescriptions Disp Refills   famotidine  (PEPCID ) 20 MG tablet [Pharmacy Med Name: FAMOTIDINE  20 MG TABLET] 180 tablet 0    Sig: TAKE 1 TABLET BY MOUTH TWICE A DAY     Gastroenterology:  H2 Antagonists Failed - 04/21/2024 11:33 AM      Failed - Valid encounter within last 12 months    Recent Outpatient Visits           3 weeks ago Gastroesophageal reflux disease, unspecified whether esophagitis present   [facility] Quinton Buck[place], [place]

## 2024-07-22 ENCOUNTER — Other Ambulatory Visit: Payer: Self-pay | Admitting: Nurse Practitioner

## 2024-07-22 DIAGNOSIS — K219 Gastro-esophageal reflux disease without esophagitis: Secondary | ICD-10-CM

## 2024-07-23 NOTE — Telephone Encounter (Signed)
 Requested Prescriptions  Pending Prescriptions Disp Refills   famotidine  (PEPCID ) 20 MG tablet [Pharmacy Med Name: FAMOTIDINE  20 MG TABLET] 180 tablet 1    Sig: TAKE 1 TABLET BY MOUTH TWICE A DAY     Gastroenterology:  H2 Antagonists Passed - 07/23/2024 11:12 AM      Passed - Valid encounter within last 12 months    Recent Outpatient Visits           3 months ago Gastroesophageal reflux disease, unspecified whether esophagitis present   [facility] Gareth Mliss [place], [place]

## 2024-08-21 ENCOUNTER — Encounter: Payer: Self-pay | Admitting: Nurse Practitioner

## 2024-08-21 ENCOUNTER — Ambulatory Visit (INDEPENDENT_AMBULATORY_CARE_PROVIDER_SITE_OTHER): Admitting: Nurse Practitioner

## 2024-08-21 VITALS — BP 120/76 | HR 72 | Resp 16 | Ht 70.0 in | Wt 201.0 lb

## 2024-08-21 DIAGNOSIS — Z0000 Encounter for general adult medical examination without abnormal findings: Secondary | ICD-10-CM | POA: Diagnosis not present

## 2024-08-21 DIAGNOSIS — Z131 Encounter for screening for diabetes mellitus: Secondary | ICD-10-CM | POA: Diagnosis not present

## 2024-08-21 DIAGNOSIS — Z13220 Encounter for screening for lipoid disorders: Secondary | ICD-10-CM | POA: Diagnosis not present

## 2024-08-21 DIAGNOSIS — M2550 Pain in unspecified joint: Secondary | ICD-10-CM

## 2024-08-21 DIAGNOSIS — E785 Hyperlipidemia, unspecified: Secondary | ICD-10-CM | POA: Insufficient documentation

## 2024-08-21 DIAGNOSIS — Z130 Encounter for screening for diseases of the blood and blood-forming organs and certain disorders involving the immune mechanism: Secondary | ICD-10-CM

## 2024-08-21 NOTE — Assessment & Plan Note (Signed)
Rechecking lipid panel 

## 2024-08-21 NOTE — Progress Notes (Signed)
 Name: Timothy Padilla   MRN: [id number]    DOB: 1991/08/17   Date:08/21/2024       Progress Note  Subjective  Chief Complaint  Chief Complaint  Patient presents with   Annual Exam    HPI  Patient presents for annual CPE.   Diet: Regular, well-balanced diet. Exercise: Active at job. Sleep: 6-7 hours of sleep per night. Last dental exam: 1 month ago Last eye exam: 3 years ago; wears corrective lens   Patient endorses intermittent joint pain in bilateral forearms that radiates to the fingers and side of thumbs. In addition, endorses bilateral shoulder pain. Denies any numbness or tingling. Has been having symptoms for the past 3 months. Pain worsens with heavy lifting. Uses Aleve for headaches, but does not help with joint or muscle pain. Uses Voltaren gel for back pain. Recommended the patient to use Voltaren gel on joints of hands. Patient verbalizes maternal family history of rheumatoid arthritis.    Depression: phq 9 is negative    08/21/2024    1:19 PM 03/27/2024    8:02 AM 02/25/2023    8:07 AM 01/15/2022    1:14 PM 01/05/2022    1:09 PM  Depression screen PHQ 2/9  Decreased Interest 0 0 0 0 0  Down, Depressed, Hopeless 0 0 0 0 0  PHQ - 2 Score 0 0 0 0 0  Altered sleeping  0 0 0   Tired, decreased energy  0 0 0   Change in appetite  0 0 0   Feeling bad or failure about yourself   0 0 0   Trouble concentrating  0 0 0   Moving slowly or fidgety/restless  0 0 0   Suicidal thoughts  0 0 0   PHQ-9 Score  0 0 0   Difficult doing work/chores  Not difficult at all Not difficult at all Not difficult at all     Hypertension:  BP Readings from Last 3 Encounters:  08/21/24 120/76  03/27/24 120/76  02/25/23 118/70    Obesity: Wt Readings from Last 3 Encounters:  08/21/24 201 lb (91.2 kg)  03/27/24 222 lb 9.6 oz (101 kg)  02/25/23 209 lb 3.2 oz (94.9 kg)   BMI Readings from Last 3 Encounters:  08/21/24 28.84 kg/m  03/27/24 31.94 kg/m  02/25/23 30.02 kg/m     Lipids:   Lab Results  Component Value Date   CHOL 191 03/27/2024   CHOL 171 02/25/2023   CHOL 149 01/15/2022   Lab Results  Component Value Date   HDL 47 03/27/2024   HDL 52 02/25/2023   HDL 44 01/15/2022   Lab Results  Component Value Date   LDLCALC 129 (H) 03/27/2024   LDLCALC 98 02/25/2023   LDLCALC 91 01/15/2022   Lab Results  Component Value Date   TRIG 62 03/27/2024   TRIG 118 02/25/2023   TRIG 56 01/15/2022   Lab Results  Component Value Date   CHOLHDL 4.1 03/27/2024   CHOLHDL 3.3 02/25/2023   CHOLHDL 3.4 01/15/2022   No results found for: LDLDIRECT Glucose:  Glucose, Bld  Date Value Ref Range Status  03/27/2024 97 65 - 99 mg/dL Final    Comment:    .            Fasting reference interval .   02/25/2023 59 (L) 65 - 99 mg/dL Final    Comment:    .            Fasting reference interval .  01/15/2022 86 65 - 99 mg/dL Final    Comment:    .            Fasting reference interval .     Flowsheet Row Office Visit from 08/21/2024 in [facility]  AUDIT-C Score 0    Married STD testing and prevention (HIV/chl/gon/syphilis): Completed 11/15/2020 Hep C: Completed 11/15/2020  Skin cancer: Discussed monitoring for atypical lesions Colorectal cancer: Does not qualify. Prostate cancer: Does not qualify. No results found for: PSA   Lung cancer:  Low Dose CT Chest recommended if Age 81-80 years, 30 pack-year currently smoking OR have quit w/in 15years. Patient does not qualify.   AAA: The USPSTF recommends one-time screening with ultrasonography in men ages 74 to 75 years who have ever smoked. Does not qualify. ECG:  09/22/2019  Vaccines:  HPV: up to at age 41 , ask insurance if age between 16-45  Shingrix: 28-64 yo and ask insurance if covered when patient above 23 yo Pneumonia: Educated and discussed with patient. Flu: Educated and discussed with patient. Declined.  Advanced Care Planning: A voluntary discussion about  advance care planning including the explanation and discussion of advance directives.  Discussed health care proxy and Living will, and the patient was able to identify a health care proxy as Timothy Padilla (wife).  Patient does not have a living will at present time. If patient does have living will, I have requested they bring this to the clinic to be scanned in to their chart.  Patient Active Problem List   Diagnosis Date Noted   Hyperlipidemia 08/21/2024   Gastroesophageal reflux disease 04/28/2020   Other viral warts 04/12/2020   Tobacco abuse 01/11/2018   Abnormal x-ray of thoracic spine 01/11/2018   Elevated blood pressure reading 03/27/2016    Past Surgical History:  Procedure Laterality Date   SCAPHOID FRACTURE SURGERY Left 2012    Family History  Problem Relation Age of Onset   Hypertension Father    Heart attack Maternal Grandmother    Hypertension Paternal Grandmother    Stroke Paternal Grandmother    COPD Paternal Grandfather     Social History   Socioeconomic History   Marital status: Single    Spouse name: Not on file   Number of children: Not on file   Years of education: Not on file   Highest education level: Not on file  Occupational History   Not on file  Tobacco Use   Smoking status: Every Day    Current packs/day: 1.50    Average packs/day: 1.5 packs/day for 20.6 years (30.9 ttl pk-yrs)    Types: Cigarettes    Start date: 01/07/2004   Smokeless tobacco: Former    Types: Snuff   Tobacco comments:    just tried smokeless 2 times, does not currently use  Vaping Use   Vaping status: Never Used  Substance and Sexual Activity   Alcohol use: Yes    Alcohol/week: 2.0 - 3.0 standard drinks of alcohol    Types: 2 - 3 Cans of beer per week    Comment: per day   Drug use: No   Sexual activity: Not Currently    Partners: Female    Birth control/protection: Condom  Other Topics Concern   Not on file  Social History Narrative   Not on file   Social  Drivers of Health   Financial Resource Strain: Low Risk  (08/21/2024)   Overall Financial Resource Strain (CARDIA)    Difficulty of  Paying Living Expenses: Not hard at all  Food Insecurity: No Food Insecurity (08/21/2024)   Hunger Vital Sign    Worried About Running Out of Food in the Last Year: Never true    Ran Out of Food in the Last Year: Never true  Transportation Needs: No Transportation Needs (08/21/2024)   PRAPARE - Administrator, Civil Service (Medical): No    Lack of Transportation (Non-Medical): No  Physical Activity: Not on file  Stress: No Stress Concern Present (08/21/2024)   [employer] of Occupational Health - Occupational Stress Questionnaire    Feeling of Stress: Not at all  Social Connections: Not on file  Intimate Partner Violence: Not At Risk (08/21/2024)   Humiliation, Afraid, Rape, and Kick questionnaire    Fear of Current or Ex-Partner: No    Emotionally Abused: No    Physically Abused: No    Sexually Abused: No     Current Outpatient Medications:    famotidine  (PEPCID ) 20 MG tablet, TAKE 1 TABLET BY MOUTH TWICE A DAY, Disp: 180 tablet, Rfl: 1   omeprazole  (PRILOSEC) 40 MG capsule, Take 1 capsule (40 mg total) by mouth daily., Disp: 90 capsule, Rfl: 3  Allergies  Allergen Reactions   Wellbutrin  [Bupropion ]     Tremor,fatigue,mood changes     ROS  Constitutional: Negative for fever or weight change.  Respiratory: Negative for cough and shortness of breath.   Cardiovascular: Negative for chest pain or palpitations.  Gastrointestinal: Negative for abdominal pain, no bowel changes.  Musculoskeletal: Negative for gait problem or joint swelling. Endorses joint pain in bilateral shoulders, forearms, and finger joints. Skin: Negative for rash.  Neurological: Negative for dizziness or headache.  No other specific complaints in a complete review of systems (except as listed in HPI above).    Objective  Vitals:   08/21/24 1325  BP:  120/76  Pulse: 72  Resp: 16  SpO2: 99%  Weight: 201 lb (91.2 kg)  Height: 5' 10 (1.778 m)    Body mass index is 28.84 kg/m.  Physical Exam Constitutional:      Appearance: Normal appearance.  HENT:     Head: Normocephalic and atraumatic.     Right Ear: Tympanic membrane, ear canal and external ear normal.     Left Ear: Tympanic membrane, ear canal and external ear normal.     Nose: Nose normal.     Mouth/Throat:     Mouth: Mucous membranes are moist.     Pharynx: Oropharynx is clear.  Eyes:     Extraocular Movements: Extraocular movements intact.     Conjunctiva/sclera: Conjunctivae normal.     Pupils: Pupils are equal, round, and reactive to light.  Cardiovascular:     Rate and Rhythm: Normal rate and regular rhythm.     Heart sounds: Normal heart sounds.  Pulmonary:     Effort: Pulmonary effort is normal.     Breath sounds: Normal breath sounds.  Abdominal:     General: Bowel sounds are normal.     Palpations: Abdomen is soft.  Musculoskeletal:        General: Normal range of motion.     Cervical back: Normal range of motion and neck supple.  Skin:    General: Skin is warm and dry.     Capillary Refill: Capillary refill takes less than 2 seconds.  Neurological:     General: No focal deficit present.     Mental Status: He is alert and oriented to person, place,  and time. Mental status is at baseline.  Psychiatric:        Mood and Affect: Mood normal.        Behavior: Behavior normal.        Thought Content: Thought content normal.        Judgment: Judgment normal.      No results found for this or any previous visit (from the past 2160 hours).   Fall Risk:    08/21/2024    1:17 PM 03/27/2024    7:57 AM 02/25/2023    8:07 AM 01/15/2022    1:14 PM 01/05/2022    1:09 PM  Fall Risk   Falls in the past year? 0 0 0 0 0  Number falls in past yr: 0 0 0 0 0  Injury with Fall? 0 0 0 0 0  Risk for fall due to : No Fall Risks  No Fall Risks    Follow up Falls  prevention discussed Falls evaluation completed Falls prevention discussed;Falls evaluation completed;Education provided  Falls evaluation completed      Data saved with a previous flowsheet row definition     Functional Status Survey: Is the patient deaf or have difficulty hearing?: No Does the patient have difficulty seeing, even when wearing glasses/contacts?: No Does the patient have difficulty concentrating, remembering, or making decisions?: No Does the patient have difficulty walking or climbing stairs?: No Does the patient have difficulty dressing or bathing?: No Does the patient have difficulty doing errands alone such as visiting a doctor's office or shopping?: No   Assessment & Plan  Problem List Items Addressed This Visit       Other   Hyperlipidemia   Rechecking lipid panel      Other Visit Diagnoses       Annual physical exam    -  Primary   Relevant Orders   CBC with Differential/Platelet   Comprehensive metabolic panel with GFR   Lipid panel   Hemoglobin A1c   TSH     Arthralgia, unspecified joint       family history of rheumatoid arthritis, will get labs recommend trying volataren gel   Relevant Orders   Rheumatoid Factor   Cyclic citrul peptide antibody, IgG   CBC with Differential/Platelet   Comprehensive metabolic panel with GFR   TSH     Screening for cholesterol level       Relevant Orders   Lipid panel     Screening for diabetes mellitus       Relevant Orders   Comprehensive metabolic panel with GFR   Hemoglobin A1c     Screening for deficiency anemia       Relevant Orders   CBC with Differential/Platelet         -Prostate cancer screening and PSA options (with potential risks and benefits of testing vs not testing) were discussed along with recent recs/guidelines. -USPSTF grade A and B recommendations reviewed with patient; age-appropriate recommendations, preventive care, screening tests, etc discussed and encouraged; healthy  living encouraged; see AVS for patient education given to patient -Discussed importance of 150 minutes of physical activity weekly, eat two servings of fish weekly, eat one serving of tree nuts ( cashews, pistachios, pecans, almonds.SABRA) every other day, eat 6 servings of fruit/vegetables daily and drink plenty of water and avoid sweet beverages.  -Reviewed Health Maintenance: Yes  I have reviewed this encounter including the documentation in this note and/or discussed this patient with the provider, Alexa Everhart SNP, I  am certifying that I agree with the content of this note as supervising/preceptor nurse practitioner.  Mliss Spray, FNP-C [facility] 08/21/2024, 2:57 PM

## 2024-08-24 ENCOUNTER — Ambulatory Visit: Payer: Self-pay | Admitting: Nurse Practitioner

## 2024-08-25 LAB — COMPREHENSIVE METABOLIC PANEL WITH GFR
AG Ratio: 1.5 (calc) (ref 1.0–2.5)
ALT: 19 U/L (ref 9–46)
AST: 13 U/L (ref 10–40)
Albumin: 4.5 g/dL (ref 3.6–5.1)
Alkaline phosphatase (APISO): 65 U/L (ref 36–130)
BUN: 15 mg/dL (ref 7–25)
CO2: 29 mmol/L (ref 20–32)
Calcium: 9.6 mg/dL (ref 8.6–10.3)
Chloride: 104 mmol/L (ref 98–110)
Creat: 0.77 mg/dL (ref 0.60–1.26)
Globulin: 3.1 g/dL (ref 1.9–3.7)
Glucose, Bld: 75 mg/dL (ref 65–99)
Potassium: 4.4 mmol/L (ref 3.5–5.3)
Sodium: 140 mmol/L (ref 135–146)
Total Bilirubin: 0.8 mg/dL (ref 0.2–1.2)
Total Protein: 7.6 g/dL (ref 6.1–8.1)
eGFR: 120 mL/min/1.73m2 (ref >=60)

## 2024-08-25 LAB — CBC WITH DIFFERENTIAL/PLATELET
Absolute Lymphocytes: 1664 {cells}/uL (ref 850–3900)
Absolute Monocytes: 390 {cells}/uL (ref 200–950)
Basophils Absolute: 20 {cells}/uL (ref 0–200)
Basophils Relative: 0.3 %
Eosinophils Absolute: 52 {cells}/uL (ref 15–500)
Eosinophils Relative: 0.8 %
HCT: 45.3 % (ref 38.5–50.0)
Hemoglobin: 15.6 g/dL (ref 13.2–17.1)
MCH: 31.2 pg (ref 27.0–33.0)
MCHC: 34.4 g/dL (ref 32.0–36.0)
MCV: 90.6 fL (ref 80.0–100.0)
MPV: 10.6 fL (ref 7.5–12.5)
Monocytes Relative: 6 %
Neutro Abs: 4375 {cells}/uL (ref 1500–7800)
Neutrophils Relative %: 67.3 %
Platelets: 190 Thousand/uL (ref 140–400)
RBC: 5 Million/uL (ref 4.20–5.80)
RDW: 11.8 % (ref 11.0–15.0)
Total Lymphocyte: 25.6 %
WBC: 6.5 Thousand/uL (ref 3.8–10.8)

## 2024-08-25 LAB — LIPID PANEL
Cholesterol: 176 mg/dL (ref <200)
HDL: 48 mg/dL (ref >=40)
LDL Cholesterol (Calc): 113 mg/dL — ABNORMAL HIGH
Non-HDL Cholesterol (Calc): 128 mg/dL (ref <130)
Total CHOL/HDL Ratio: 3.7 (calc) (ref <5.0)
Triglycerides: 61 mg/dL (ref <150)

## 2024-08-25 LAB — RHEUMATOID FACTOR: Rheumatoid fact SerPl-aCnc: <10 [IU]/mL (ref <14)

## 2024-08-25 LAB — TSH: TSH: 1.36 m[IU]/L (ref 0.40–4.50)

## 2024-08-25 LAB — HEMOGLOBIN A1C
Hgb A1c MFr Bld: 4.8 % (ref <5.7)
Mean Plasma Glucose: 91 mg/dL
eAG (mmol/L): 5 mmol/L

## 2024-08-25 LAB — CYCLIC CITRUL PEPTIDE ANTIBODY, IGG: Cyclic Citrullin Peptide Ab: <16 U

## 2024-09-04 LAB — COMPREHENSIVE METABOLIC PANEL WITH GFR
AG Ratio: 1.4 (calc) (ref 1.0–2.5)
ALT: 30 U/L (ref 9–46)
AST: 19 U/L (ref 10–40)
Albumin: 4.4 g/dL (ref 3.6–5.1)
Alkaline phosphatase (APISO): 63 U/L (ref 36–130)
BUN: 19 mg/dL (ref 7–25)
CO2: 28 mmol/L (ref 20–32)
Calcium: 9.5 mg/dL (ref 8.6–10.3)
Chloride: 104 mmol/L (ref 98–110)
Creat: 0.73 mg/dL (ref 0.60–1.26)
Globulin: 3.1 g/dL (ref 1.9–3.7)
Glucose, Bld: 97 mg/dL (ref 65–139)
Potassium: 4.8 mmol/L (ref 3.5–5.3)
Sodium: 140 mmol/L (ref 135–146)
Total Bilirubin: 0.7 mg/dL (ref 0.2–1.2)
Total Protein: 7.5 g/dL (ref 6.1–8.1)
eGFR: 123 mL/min/1.73m2 (ref >=60)

## 2024-09-04 LAB — CBC WITH DIFFERENTIAL/PLATELET
Absolute Lymphocytes: 1570 {cells}/uL (ref 850–3900)
Absolute Monocytes: 334 {cells}/uL (ref 200–950)
Basophils Absolute: 28 {cells}/uL (ref 0–200)
Basophils Relative: 0.6 %
Eosinophils Absolute: 113 {cells}/uL (ref 15–500)
Eosinophils Relative: 2.4 %
HCT: 48.9 % (ref 38.5–50.0)
Hemoglobin: 16.3 g/dL (ref 13.2–17.1)
MCH: 30.8 pg (ref 27.0–33.0)
MCHC: 33.3 g/dL (ref 32.0–36.0)
MCV: 92.3 fL (ref 80.0–100.0)
MPV: 10.4 fL (ref 7.5–12.5)
Monocytes Relative: 7.1 %
Neutro Abs: 2656 {cells}/uL (ref 1500–7800)
Neutrophils Relative %: 56.5 %
Platelets: 186 Thousand/uL (ref 140–400)
RBC: 5.3 Million/uL (ref 4.20–5.80)
RDW: 12.1 % (ref 11.0–15.0)
Total Lymphocyte: 33.4 %
WBC: 4.7 Thousand/uL (ref 3.8–10.8)

## 2024-09-04 LAB — LIPID PANEL
Cholesterol: 191 mg/dL (ref <200)
HDL: 47 mg/dL (ref >=40)
LDL Cholesterol (Calc): 129 mg/dL — ABNORMAL HIGH
Non-HDL Cholesterol (Calc): 144 mg/dL — ABNORMAL HIGH (ref <130)
Total CHOL/HDL Ratio: 4.1 (calc) (ref <5.0)
Triglycerides: 62 mg/dL (ref <150)

## 2024-09-04 LAB — HEMOGLOBIN A1C
Hgb A1c MFr Bld: 4.8 % (ref <5.7)
Mean Plasma Glucose: 91 mg/dL
eAG (mmol/L): 5 mmol/L

## 2025-08-27 ENCOUNTER — Encounter: Admitting: Nurse Practitioner
